# Patient Record
Sex: Female | Born: 1990 | Hispanic: No | Marital: Married | Smoking: Never smoker
Health system: Southern US, Community
[De-identification: ages and names within clinical notes are randomized; demographics above are authoritative.]

## PROBLEM LIST (undated history)

## (undated) ENCOUNTER — Inpatient Hospital Stay (HOSPITAL_COMMUNITY): Payer: Self-pay

## (undated) DIAGNOSIS — Z789 Other specified health status: Secondary | ICD-10-CM

## (undated) DIAGNOSIS — A0100 Typhoid fever, unspecified: Secondary | ICD-10-CM

---

## 2015-07-02 ENCOUNTER — Ambulatory Visit (HOSPITAL_COMMUNITY): Payer: Self-pay

## 2015-07-02 ENCOUNTER — Ambulatory Visit (HOSPITAL_COMMUNITY)
Admission: RE | Admit: 2015-07-02 | Discharge: 2015-07-02 | Disposition: A | Discharge status: Home / Self Care | Payer: Self-pay | Source: Ambulatory Visit | Attending: Nurse Practitioner | Admitting: Nurse Practitioner

## 2015-07-02 ENCOUNTER — Ambulatory Visit (HOSPITAL_COMMUNITY): Admission: RE | Admit: 2015-07-02 | Payer: Self-pay | Source: Ambulatory Visit

## 2015-07-02 ENCOUNTER — Other Ambulatory Visit: Payer: Self-pay | Admitting: Nurse Practitioner

## 2015-07-02 DIAGNOSIS — N23 Unspecified renal colic: Secondary | ICD-10-CM

## 2015-07-02 DIAGNOSIS — R109 Unspecified abdominal pain: Secondary | ICD-10-CM

## 2015-07-02 DIAGNOSIS — R8290 Unspecified abnormal findings in urine: Secondary | ICD-10-CM | POA: Insufficient documentation

## 2016-03-21 NOTE — L&D Delivery Note (Signed)
Delivery Note At 3:14 PM a viable female was delivered via Vaginal, Spontaneous (Presentation: ;  ).  APGAR: 6, 8; weight  .   Placenta status: , .  Cord:  with the following complications: .  Cord pH: NA  Anesthesia:  epidural Episiotomy: None Lacerations: 1st degree Suture Repair: hemostatic, no repair  Est. Blood Loss (mL): 250  Mom to postpartum.  Baby to Couplet care / Skin to Skin.  Thressa ShellerHeather Kassandra Meriweather 01/28/2017, 3:50 PM

## 2016-06-21 ENCOUNTER — Encounter: Payer: Self-pay | Admitting: Emergency Medicine

## 2016-06-21 ENCOUNTER — Emergency Department (HOSPITAL_COMMUNITY)
Admission: EM | Admit: 2016-06-21 | Discharge: 2016-06-22 | Disposition: A | Discharge status: Home / Self Care | Payer: Medicaid Other | Attending: Emergency Medicine | Admitting: Emergency Medicine

## 2016-06-21 DIAGNOSIS — O2310 Infections of bladder in pregnancy, unspecified trimester: Secondary | ICD-10-CM | POA: Diagnosis not present

## 2016-06-21 DIAGNOSIS — Z3A08 8 weeks gestation of pregnancy: Secondary | ICD-10-CM | POA: Diagnosis not present

## 2016-06-21 DIAGNOSIS — O98511 Other viral diseases complicating pregnancy, first trimester: Secondary | ICD-10-CM | POA: Diagnosis not present

## 2016-06-21 DIAGNOSIS — O9989 Other specified diseases and conditions complicating pregnancy, childbirth and the puerperium: Secondary | ICD-10-CM | POA: Diagnosis present

## 2016-06-21 DIAGNOSIS — O0281 Inappropriate change in quantitative human chorionic gonadotropin (hCG) in early pregnancy: Secondary | ICD-10-CM | POA: Diagnosis not present

## 2016-06-21 DIAGNOSIS — R69 Illness, unspecified: Secondary | ICD-10-CM

## 2016-06-21 DIAGNOSIS — J111 Influenza due to unidentified influenza virus with other respiratory manifestations: Secondary | ICD-10-CM | POA: Diagnosis not present

## 2016-06-21 DIAGNOSIS — N3000 Acute cystitis without hematuria: Secondary | ICD-10-CM

## 2016-06-21 LAB — CBC WITH DIFFERENTIAL/PLATELET
BASOS ABS: 0 10*3/uL (ref 0.0–0.1)
BASOS PCT: 0 %
EOS ABS: 0 10*3/uL (ref 0.0–0.7)
EOS PCT: 0 %
HCT: 33.8 % — ABNORMAL LOW (ref 36.0–46.0)
Hemoglobin: 11.5 g/dL — ABNORMAL LOW (ref 12.0–15.0)
Lymphocytes Relative: 12 %
Lymphs Abs: 0.8 10*3/uL (ref 0.7–4.0)
MCH: 29.6 pg (ref 26.0–34.0)
MCHC: 34 g/dL (ref 30.0–36.0)
MCV: 87.1 fL (ref 78.0–100.0)
MONO ABS: 0.6 10*3/uL (ref 0.1–1.0)
Monocytes Relative: 8 %
Neutro Abs: 5.5 10*3/uL (ref 1.7–7.7)
Neutrophils Relative %: 80 %
PLATELETS: 126 10*3/uL — AB (ref 150–400)
RBC: 3.88 MIL/uL (ref 3.87–5.11)
RDW: 13.3 % (ref 11.5–15.5)
WBC: 6.9 10*3/uL (ref 4.0–10.5)

## 2016-06-21 LAB — BASIC METABOLIC PANEL
Anion gap: 9 (ref 5–15)
BUN: 12 mg/dL (ref 6–20)
CALCIUM: 9 mg/dL (ref 8.9–10.3)
CO2: 23 mmol/L (ref 22–32)
Chloride: 103 mmol/L (ref 101–111)
Creatinine, Ser: 0.52 mg/dL (ref 0.44–1.00)
GFR calc Af Amer: >60 mL/min (ref >60)
GLUCOSE: 130 mg/dL — AB (ref 65–99)
POTASSIUM: 3.2 mmol/L — AB (ref 3.5–5.1)
Sodium: 135 mmol/L (ref 135–145)

## 2016-06-21 LAB — RAPID STREP SCREEN (MED CTR MEBANE ONLY): STREPTOCOCCUS, GROUP A SCREEN (DIRECT): NEGATIVE

## 2016-06-21 LAB — HCG, QUANTITATIVE, PREGNANCY: HCG, BETA CHAIN, QUANT, S: 132578 m[IU]/mL — AB (ref <5)

## 2016-06-21 NOTE — ED Triage Notes (Addendum)
Patient here with fever and sore throat.  Patient is also 2 months pregnant, has many questions on what to take for meds for fever and why she has a fever.  Patient is very nauseous especially with prenatal vitamins.  She would like a prescription for nausea medicine.  She has her first prenatal appointment with OBGYN on April 19th.  Patient speaks Urdu.

## 2016-06-22 ENCOUNTER — Emergency Department (HOSPITAL_COMMUNITY): Payer: Medicaid Other

## 2016-06-22 LAB — URINALYSIS, ROUTINE W REFLEX MICROSCOPIC
Bilirubin Urine: NEGATIVE
Glucose, UA: NEGATIVE mg/dL
Hgb urine dipstick: NEGATIVE
Ketones, ur: 5 mg/dL — AB
Nitrite: NEGATIVE
Protein, ur: NEGATIVE mg/dL
Specific Gravity, Urine: 1.029 (ref 1.005–1.030)
pH: 5 (ref 5.0–8.0)

## 2016-06-22 LAB — INFLUENZA PANEL BY PCR (TYPE A & B)
Influenza A By PCR: NEGATIVE
Influenza B By PCR: NEGATIVE

## 2016-06-22 MED ORDER — CEPHALEXIN 500 MG PO CAPS: 500.0000 mg | ORAL_CAPSULE | Freq: Two times a day (BID) | ORAL | 0 refills | Status: DC

## 2016-06-22 MED ORDER — OSELTAMIVIR PHOSPHATE 75 MG PO CAPS: 75.0000 mg | ORAL_CAPSULE | Freq: Two times a day (BID) | ORAL | 0 refills | Status: DC

## 2016-06-22 NOTE — Discharge Instructions (Signed)
Please read attached information. If you experience any new or worsening signs or symptoms please return to the emergency room for evaluation. Please follow-up with your primary care provider or specialist as discussed. Please use medication prescribed only as directed and discontinue taking if you have any concerning signs or symptoms.   °

## 2016-06-22 NOTE — ED Provider Notes (Signed)
[facility]-EMERGENCY DEPT Provider Note   CSN: [id number] Arrival date & time: 06/21/16  1933   History   Chief Complaint Chief Complaint  Patient presents with  . Fever  . Sore Throat    HPI Amy Young is a 26 y.o. female.  HPI   26 year old female presents today with complaints of upper respiratory infection.  Patient notes over the last 2 days she has had low-grade fever at 100, rhinorrhea, sore throat and dry nonproductive cough.  Patient notes diffuse body aches.  Patient denies any abdominal pain, chest pain, shortness of breath.  She denies any urinary complaints.  She denies any chronic health conditions or daily medications, notes taking Tylenol at 6 PM this evening.  Patient reports that she is 2 months pregnant.  No recent travel.  She notes that her daughter is experiencing the same symptoms.   History reviewed. No pertinent past medical history.  There are no active problems to display for this patient.   History reviewed. No pertinent surgical history.  OB History    Gravida Para Term Preterm AB Living   1             SAB TAB Ectopic Multiple Live Births                  Home Medications    Prior to Admission medications   Medication Sig Start Date End Date Taking? Authorizing Provider  cephALEXin (KEFLEX) 500 MG capsule Take 1 capsule (500 mg total) by mouth 2 (two) times daily. 06/22/16   Eyvonne Mechanic, PA-C  oseltamivir (TAMIFLU) 75 MG capsule Take 1 capsule (75 mg total) by mouth every 12 (twelve) hours. 06/22/16   Eyvonne Mechanic, PA-C    Family History History reviewed. No pertinent family history.  Social History Social History  Substance Use Topics  . Smoking status: Never Smoker  . Smokeless tobacco: Never Used  . Alcohol use Not on file     Allergies   Patient has no known allergies.   Review of Systems Review of Systems  All other systems reviewed and are negative.    Physical Exam Updated Vital Signs BP 113/74   Pulse 92   Temp  100 F (37.8 C) (Oral)   Resp 16   Wt 53.9 kg   LMP 04/28/2016   SpO2 99%   Physical Exam  Constitutional: She is oriented to person, place, and time. She appears well-developed and well-nourished.  HENT:  Head: Normocephalic and atraumatic.  Mouth/Throat: Uvula is midline, oropharynx is clear and moist and mucous membranes are normal. No oropharyngeal exudate, posterior oropharyngeal edema, posterior oropharyngeal erythema or tonsillar abscesses.  Rhinorrhea   Eyes: Conjunctivae are normal. Pupils are equal, round, and reactive to light. Right eye exhibits no discharge. Left eye exhibits no discharge. No scleral icterus.  Neck: Normal range of motion. No JVD present. No tracheal deviation present.  Pulmonary/Chest: Effort normal. No stridor.  Neurological: She is alert and oriented to person, place, and time. Coordination normal.  Psychiatric: She has a normal mood and affect. Her behavior is normal. Judgment and thought content normal.  Nursing note and vitals reviewed.    ED Treatments / Results  Labs (all labs ordered are listed, but only abnormal results are displayed) Labs Reviewed  HCG, QUANTITATIVE, PREGNANCY - Abnormal; Notable for the following:       Result Value   hCG, Beta Chain, Quant, S 132,578 (*)    All other components within normal limits  CBC WITH  DIFFERENTIAL/PLATELET - Abnormal; Notable for the following:    Hemoglobin 11.5 (*)    HCT 33.8 (*)    Platelets 126 (*)    All other components within normal limits  BASIC METABOLIC PANEL - Abnormal; Notable for the following:    Potassium 3.2 (*)    Glucose, Bld 130 (*)    All other components within normal limits  URINALYSIS, ROUTINE W REFLEX MICROSCOPIC - Abnormal; Notable for the following:    APPearance CLOUDY (*)    Ketones, ur 5 (*)    Leukocytes, UA MODERATE (*)    Bacteria, UA RARE (*)    Squamous Epithelial / LPF 6-30 (*)    All other components within normal limits  RAPID STREP SCREEN (NOT AT  [facility])  CULTURE, GROUP A STREP [facility])  URINE CULTURE  INFLUENZA PANEL BY PCR (TYPE A & B)    EKG  EKG Interpretation None       Radiology Dg Chest 2 View  Result Date: 06/22/2016 CLINICAL DATA:  Fever and sore throat EXAM: CHEST  2 VIEW COMPARISON:  None. FINDINGS: The heart size and mediastinal contours are within normal limits. Both lungs are clear. Mild S-shaped scoliosis of the thoracolumbar spine. IMPRESSION: No active cardiopulmonary disease. S-shaped thoracolumbar scoliosis. Electronically Signed   By: Tollie Eth M.D.   On: 06/22/2016 01:26    Procedures Procedures (including critical care time)  Medications Ordered in ED Medications - No data to display   Initial Impression / Assessment and Plan / ED Course  I have reviewed the triage vital signs and the nursing notes.  Pertinent labs & imaging results that were available during my care of the patient were reviewed by me and considered in my medical decision making (see chart for details).     Final Clinical Impressions(s) / ED Diagnoses   Final diagnoses:  Acute cystitis without hematuria  Influenza-like illness    Labs: Influenza, urinalysis, rapid strep, hCG, CBC, BMP  Imaging: DG Chest 2 View (shielded)   Consults:  Therapeutics:  Discharge Meds:   Assessment/Plan: 26 year old female presents today with viral type illness.  She reports initial onset of sore throat followed by body aches, dry nonproductive cough, fever fatigue.  Patient also notes lower back pain and body aches.  Patient's presentation is most consistent with influenza type illness although her influenza panel here is negative.  Although it is negative there is still a probability that this could be influenza based on my clinical suspicion.  Treatment versus nontreatment with the patient was discussed with my recommendation of patient receiving Tamiflu.  Patient also noted to have pyuria, she will be treated with Keflex and a urine culture  will be sent.  Patient is instructed to follow-up with primary care and OB/GYN as previously scheduled, return to emergency room immediately if any new or worsening signs or symptoms present.  She verbalized understanding and agreement to today's plan had no further questions or concerns at the time discharge    New Prescriptions New Prescriptions   CEPHALEXIN (KEFLEX) 500 MG CAPSULE    Take 1 capsule (500 mg total) by mouth 2 (two) times daily.   OSELTAMIVIR (TAMIFLU) 75 MG CAPSULE    Take 1 capsule (75 mg total) by mouth every 12 (twelve) hours.     Eyvonne Mechanic, PA-C 06/22/16 4098    Shon Baton, MD 06/22/16 [phone]

## 2016-06-23 LAB — CULTURE, GROUP A STREP (THRC)

## 2016-06-23 LAB — URINE CULTURE: Culture: 70000 — AB

## 2016-06-24 ENCOUNTER — Telehealth: Payer: Self-pay

## 2016-06-24 NOTE — Telephone Encounter (Deleted)
Post ED Visit - Positive Culture Follow-up: Unsuccessful Patient Follow-up  Culture assessed and recommendations reviewed by:   Enzo Bi, Pharm.D.  Celedonio Miyamoto, Pharm.D., BCPS AQ-ID  Garvin Fila, Pharm.D., BCPS  Georgina Pillion, Pharm.D., BCPS  [place], [address].D., BCPS, AAHIVP  Estella Husk, Pharm.D., BCPS, AAHIVP  Lysle Pearl, PharmD, BCPS  Casilda Carls, PharmD, BCPS  Pollyann Samples, PharmD, BCPS Katie Adriana Simas Pharm D Positive urine culture   Patient discharged without antimicrobial prescription and treatment is now indicated  Organism is resistant to prescribed ED discharge antimicrobial  Patient with positive blood cultures   Unable to contact patient after 3 attempts, letter will be sent to address on file  Jerry Caras 06/24/2016, 9:38 AM

## 2016-06-24 NOTE — Progress Notes (Signed)
ED Antimicrobial Stewardship Positive Culture Follow Up   Amy Young is an 26 y.o. female who presented to [facility] on 06/21/2016 with a chief complaint of  Chief Complaint  Patient presents with  . Fever  . Sore Throat    Recent Results (from the past 720 hour(s))  Rapid strep screen     Status: None   Collection Time: 06/21/16  8:41 PM  Result Value Ref Range Status   Streptococcus, Group A Screen (Direct) NEGATIVE NEGATIVE Final    Comment: (NOTE) A Rapid Antigen test may result negative if the antigen level in the sample is below the detection level of this test. The FDA has not cleared this test as a stand-alone test therefore the rapid antigen negative result has reflexed to a Group A Strep culture.   Culture, group A strep     Status: None   Collection Time: 06/21/16  8:41 PM  Result Value Ref Range Status   Specimen Description THROAT  Final   Special Requests NONE  Final   Culture ABUNDANT GROUP A STREP (S.PYOGENES) ISOLATED  Final   Report Status 06/23/2016 FINAL  Final  Urine culture     Status: Abnormal   Collection Time: 06/22/16  1:39 AM  Result Value Ref Range Status   Specimen Description URINE, CLEAN CATCH  Final   Special Requests NONE  Final   Culture 70,000 COLONIES/mL LACTOBACILLUS SPECIES (A)  Final   Report Status 06/23/2016 FINAL  Final      Patient discharged originally without antimicrobial agent and treatment is now indicated  New antibiotic prescription: Start Amoxicillin 500 mg BID x 10 days.  Stop taking cephalexin.   ED Provider: Marlon Pel, PA-C   Oda Cogan, PharmD Candidate 06/24/2016, 8:47 AM

## 2016-06-24 NOTE — Telephone Encounter (Signed)
Post ED Visit - Positive Culture Follow-up: Unsuccessful Patient Follow-up  Culture assessed and recommendations reviewed by:   Enzo Bi, Pharm.D.  Celedonio Miyamoto, Pharm.D., BCPS AQ-ID  Garvin Fila, Pharm.D., BCPS  Georgina Pillion, [address].D., BCPS  [employer], [address].D., BCPS, AAHIVP  Estella Husk, Pharm.D., BCPS, AAHIVP  Lysle Pearl, PharmD, BCPS  Casilda Carls, PharmD, BCPS  Pollyann Samples, PharmD, BCPS Katie Adriana Simas Pharm D Positive Strep culture   Patient discharged without antimicrobial prescription and treatment is now indicated  Organism is resistant to prescribed ED discharge antimicrobial  Patient with positive blood cultures   Unable to contact patient after 3 attempts, letter will be sent to address on file  Jerry Caras 06/24/2016, 10:17 AM

## 2016-07-11 LAB — OB RESULTS CONSOLE VARICELLA ZOSTER ANTIBODY, IGG: Varicella: IMMUNE

## 2016-07-11 LAB — OB RESULTS CONSOLE RPR: RPR: NONREACTIVE

## 2016-07-11 LAB — OB RESULTS CONSOLE HIV ANTIBODY (ROUTINE TESTING): HIV: NONREACTIVE

## 2016-07-11 LAB — OB RESULTS CONSOLE HEPATITIS B SURFACE ANTIGEN: Hepatitis B Surface Ag: NEGATIVE

## 2016-07-11 LAB — OB RESULTS CONSOLE RUBELLA ANTIBODY, IGM: RUBELLA: IMMUNE

## 2016-07-25 ENCOUNTER — Encounter (HOSPITAL_COMMUNITY): Payer: Self-pay | Admitting: *Deleted

## 2016-07-26 ENCOUNTER — Other Ambulatory Visit (HOSPITAL_COMMUNITY): Payer: Self-pay | Admitting: Nurse Practitioner

## 2016-07-26 DIAGNOSIS — Z3682 Encounter for antenatal screening for nuchal translucency: Secondary | ICD-10-CM

## 2016-07-26 DIAGNOSIS — Z3A13 13 weeks gestation of pregnancy: Secondary | ICD-10-CM

## 2016-07-27 ENCOUNTER — Ambulatory Visit (HOSPITAL_COMMUNITY)
Admission: RE | Admit: 2016-07-27 | Discharge: 2016-07-27 | Disposition: A | Discharge status: Home / Self Care | Payer: Medicaid Other | Source: Ambulatory Visit | Attending: Nurse Practitioner | Admitting: Nurse Practitioner

## 2016-07-27 ENCOUNTER — Other Ambulatory Visit (HOSPITAL_COMMUNITY): Payer: Self-pay | Admitting: Nurse Practitioner

## 2016-07-27 ENCOUNTER — Encounter (HOSPITAL_COMMUNITY): Payer: Self-pay

## 2016-07-27 DIAGNOSIS — Z3682 Encounter for antenatal screening for nuchal translucency: Secondary | ICD-10-CM | POA: Insufficient documentation

## 2016-07-27 DIAGNOSIS — Z3A13 13 weeks gestation of pregnancy: Secondary | ICD-10-CM | POA: Diagnosis not present

## 2016-07-27 DIAGNOSIS — Z843 Family history of consanguinity: Secondary | ICD-10-CM

## 2016-07-27 HISTORY — DX: Other specified health status: Z78.9

## 2016-08-01 ENCOUNTER — Other Ambulatory Visit: Payer: Self-pay

## 2016-09-20 ENCOUNTER — Telehealth: Payer: Self-pay | Admitting: Emergency Medicine

## 2016-09-20 NOTE — Telephone Encounter (Signed)
LOST TO FOLLOWUP 

## 2017-01-02 ENCOUNTER — Encounter (HOSPITAL_COMMUNITY): Payer: Self-pay | Admitting: *Deleted

## 2017-01-02 ENCOUNTER — Inpatient Hospital Stay (HOSPITAL_COMMUNITY)
Admission: AD | Admit: 2017-01-02 | Discharge: 2017-01-02 | Disposition: A | Discharge status: Home / Self Care | Payer: Medicaid Other | Source: Ambulatory Visit | Attending: Family Medicine | Admitting: Family Medicine

## 2017-01-02 DIAGNOSIS — O368130 Decreased fetal movements, third trimester, not applicable or unspecified: Secondary | ICD-10-CM | POA: Diagnosis not present

## 2017-01-02 DIAGNOSIS — Z3A36 36 weeks gestation of pregnancy: Secondary | ICD-10-CM | POA: Insufficient documentation

## 2017-01-02 DIAGNOSIS — O34211 Maternal care for low transverse scar from previous cesarean delivery: Secondary | ICD-10-CM | POA: Diagnosis not present

## 2017-01-02 LAB — OB RESULTS CONSOLE GBS: STREP GROUP B AG: NEGATIVE

## 2017-01-02 LAB — URINALYSIS, ROUTINE W REFLEX MICROSCOPIC
Bilirubin Urine: NEGATIVE
Glucose, UA: NEGATIVE mg/dL
Hgb urine dipstick: NEGATIVE
Ketones, ur: NEGATIVE mg/dL
Nitrite: NEGATIVE
Protein, ur: NEGATIVE mg/dL
SPECIFIC GRAVITY, URINE: 1.008 (ref 1.005–1.030)
pH: 7 (ref 5.0–8.0)

## 2017-01-02 NOTE — MAU Note (Signed)
Decreased fetal movement States feels some movement but not as much as usual Ongoing for the past 4-5 days  Had a non-reactive NST at the [facility] department.  Denies LOF or VB.

## 2017-01-02 NOTE — Discharge Instructions (Signed)

## 2017-01-02 NOTE — MAU Provider Note (Signed)
History     CSN: [id number]  Arrival date and time: 01/02/17 7829   First Provider Initiated Contact with Patient 01/02/17 1723    Pashto interpreter via [employer] Complaint  Patient presents with  . Decreased Fetal Movement  . non reactive nst   Amy Young is a 26 y.o. G2P1001 at [redacted]w[redacted]d sent from [facility], [place] due to report of decreased FM and NR-NST. She states the baby only moved once this morning before her PN visit, but she is perceiving good FM at present. FM has been less than usual for the last 6 days.  No contractions, abdominal pain, LOF or vaginal bleeding.  Pregnancy significant for previous C/S, consanquinity (FOB 1st cousing), borderline stable thrombocytopenia.     OB History  Gravida Para Term Preterm AB Living  SAB TAB Ectopic Multiple Live Births               # Outcome Date GA Lbr Len/2nd Weight Sex Delivery Anes PTL Lv  2 Current           1 Term      CS-LTranv          Past Medical History:  Diagnosis Date  . Medical history non-contributory     Past Surgical History:  Procedure Laterality Date  . CESAREAN SECTION      History reviewed. No pertinent family history.  Social History  Substance Use Topics  . Smoking status: Never Smoker  . Smokeless tobacco: Never Used  . Alcohol use No    Allergies: No Known Allergies  Prescriptions Prior to Admission  Medication Sig Dispense Refill Last Dose  . cephALEXin (KEFLEX) 500 MG capsule Take 1 capsule (500 mg total) by mouth 2 (two) times daily. (Patient not taking: Reported on 07/27/2016) 10 capsule 0 Not Taking  . oseltamivir (TAMIFLU) 75 MG capsule Take 1 capsule (75 mg total) by mouth every 12 (twelve) hours. (Patient not taking: Reported on 07/27/2016) 10 capsule 0 Not Taking  . Prenatal Vit-Fe Fumarate-FA (PRENATAL VITAMIN PO) Take by mouth.   Taking    Review of Systems  Constitutional: Negative for fever.  Gastrointestinal: Negative for abdominal pain, nausea and  vomiting.  Genitourinary: Negative for dysuria, frequency, vaginal bleeding and vaginal discharge.  Hematological: Does not bruise/bleed easily.   Physical Exam   Blood pressure 117/67, pulse 87, temperature 97.8 F (36.6 C), temperature source Oral, resp. rate 16, weight 146 lb 1.9 oz (66.3 kg), last menstrual period 04/21/2016, SpO2 100 %.  Physical Exam  Constitutional: She is oriented to person, place, and time. She appears well-developed and well-nourished. No distress.  HENT:  Head: Normocephalic.  Eyes: Pupils are equal, round, and reactive to light.  Neck: Neck supple.  Cardiovascular: Normal rate.   Respiratory: Effort normal.  GI: Soft. There is no tenderness. There is no guarding.  FH 35cm cephalic  Musculoskeletal: She exhibits no edema.  Neurological: She is alert and oriented to person, place, and time.  Skin: Skin is warm and dry.  Psychiatric: She has a normal mood and affect. Her behavior is normal.    MAU Course  Procedures  Fetal monitoring Baseline 120-125, moderate variability, accelerations present, no decelerations Toco: occasional mild UC  Brief [place] by me: cephalic, normal AFV   Assessment and Plan  G2P1001 at [redacted]w[redacted]d 1. Decreased fetal movement affecting management of pregnancy in third trimester, single or unspecified fetus    Fetal well-being by reactive  NST and subjectively normal AFV  Allergies as of 01/02/2017   No Known Allergies     Medication List    STOP taking these medications   cephALEXin 500 MG capsule Commonly known as:  KEFLEX   oseltamivir 75 MG capsule Commonly known as:  TAMIFLU     TAKE these medications   PRENATAL VITAMIN PO Take by mouth.      Follow-up Information    Department, [facility]. Schedule an appointment as soon as possible for a visit in 1 week(s).   Contact information: [address] [place] [address] [phone]          Merle Cirelli CNM 01/02/2017, 5:24 PM

## 2017-01-28 ENCOUNTER — Inpatient Hospital Stay (HOSPITAL_COMMUNITY): Payer: Medicaid Other | Admitting: Anesthesiology

## 2017-01-28 ENCOUNTER — Inpatient Hospital Stay (HOSPITAL_COMMUNITY)
Admission: AD | Admit: 2017-01-28 | Discharge: 2017-01-28 | Disposition: A | Discharge status: Home / Self Care | Payer: Medicaid Other | Source: Ambulatory Visit | Attending: Obstetrics & Gynecology | Admitting: Obstetrics & Gynecology

## 2017-01-28 ENCOUNTER — Encounter (HOSPITAL_COMMUNITY): Payer: Self-pay

## 2017-01-28 ENCOUNTER — Inpatient Hospital Stay (HOSPITAL_COMMUNITY)
Admission: AD | Admit: 2017-01-28 | Discharge: 2017-01-29 | DRG: 806 | Disposition: A | Discharge status: Home / Self Care | Payer: Medicaid Other | Source: Ambulatory Visit | Attending: Obstetrics & Gynecology | Admitting: Obstetrics & Gynecology

## 2017-01-28 ENCOUNTER — Other Ambulatory Visit: Payer: Self-pay

## 2017-01-28 DIAGNOSIS — D696 Thrombocytopenia, unspecified: Secondary | ICD-10-CM | POA: Diagnosis present

## 2017-01-28 DIAGNOSIS — O34211 Maternal care for low transverse scar from previous cesarean delivery: Principal | ICD-10-CM | POA: Diagnosis present

## 2017-01-28 DIAGNOSIS — O9912 Other diseases of the blood and blood-forming organs and certain disorders involving the immune mechanism complicating childbirth: Secondary | ICD-10-CM | POA: Diagnosis present

## 2017-01-28 DIAGNOSIS — O471 False labor at or after 37 completed weeks of gestation: Secondary | ICD-10-CM

## 2017-01-28 DIAGNOSIS — O34219 Maternal care for unspecified type scar from previous cesarean delivery: Secondary | ICD-10-CM

## 2017-01-28 DIAGNOSIS — Z3A40 40 weeks gestation of pregnancy: Secondary | ICD-10-CM

## 2017-01-28 DIAGNOSIS — Z3483 Encounter for supervision of other normal pregnancy, third trimester: Secondary | ICD-10-CM | POA: Diagnosis present

## 2017-01-28 LAB — CBC
HEMATOCRIT: 37.1 % (ref 36.0–46.0)
Hemoglobin: 12.9 g/dL (ref 12.0–15.0)
MCH: 32.6 pg (ref 26.0–34.0)
MCHC: 34.8 g/dL (ref 30.0–36.0)
MCV: 93.7 fL (ref 78.0–100.0)
Platelets: 138 10*3/uL — ABNORMAL LOW (ref 150–400)
RBC: 3.96 MIL/uL (ref 3.87–5.11)
RDW: 12.3 % (ref 11.5–15.5)
WBC: 9.5 10*3/uL (ref 4.0–10.5)

## 2017-01-28 LAB — TYPE AND SCREEN
ABO/RH(D): A POS
ANTIBODY SCREEN: NEGATIVE

## 2017-01-28 LAB — ABO/RH: ABO/RH(D): A POS

## 2017-01-28 MED ORDER — WITCH HAZEL-GLYCERIN EX PADS: 1.0000 "application " | MEDICATED_PAD | CUTANEOUS | Status: DC | PRN

## 2017-01-28 MED ORDER — DIBUCAINE 1 % RE OINT: 1.0000 "application " | TOPICAL_OINTMENT | RECTAL | Status: DC | PRN

## 2017-01-28 MED ORDER — IBUPROFEN 600 MG PO TABS
600.0000 mg | ORAL_TABLET | Freq: Four times a day (QID) | ORAL | Status: DC
  Administered 2017-01-28 – 2017-01-29 (×4): 600 mg via ORAL
  Filled 2017-01-28 (×5): qty 1

## 2017-01-28 MED ORDER — ONDANSETRON HCL 4 MG PO TABS: 4.0000 mg | ORAL_TABLET | ORAL | Status: DC | PRN

## 2017-01-28 MED ORDER — LACTATED RINGERS IV SOLN
INTRAVENOUS | Status: DC
  Administered 2017-01-28 (×2): via INTRAVENOUS

## 2017-01-28 MED ORDER — EPHEDRINE 5 MG/ML INJ
10.0000 mg | INTRAVENOUS | Status: DC | PRN
  Filled 2017-01-28: qty 2

## 2017-01-28 MED ORDER — ONDANSETRON HCL 4 MG/2ML IJ SOLN: 4.0000 mg | INTRAMUSCULAR | Status: DC | PRN

## 2017-01-28 MED ORDER — DIPHENHYDRAMINE HCL 25 MG PO CAPS: 25.0000 mg | ORAL_CAPSULE | Freq: Four times a day (QID) | ORAL | Status: DC | PRN

## 2017-01-28 MED ORDER — OXYTOCIN BOLUS FROM INFUSION
500.0000 mL | Freq: Once | INTRAVENOUS | Status: AC
  Administered 2017-01-28: 500 mL via INTRAVENOUS

## 2017-01-28 MED ORDER — FENTANYL CITRATE (PF) 100 MCG/2ML IJ SOLN: 50.0000 ug | INTRAMUSCULAR | Status: DC | PRN

## 2017-01-28 MED ORDER — LACTATED RINGERS IV SOLN: 500.0000 mL | INTRAVENOUS | Status: DC | PRN

## 2017-01-28 MED ORDER — ACETAMINOPHEN 325 MG PO TABS: 650.0000 mg | ORAL_TABLET | ORAL | Status: DC | PRN

## 2017-01-28 MED ORDER — PHENYLEPHRINE 40 MCG/ML (10ML) SYRINGE FOR IV PUSH (FOR BLOOD PRESSURE SUPPORT)
80.0000 ug | PREFILLED_SYRINGE | INTRAVENOUS | Status: DC | PRN
  Filled 2017-01-28: qty 10
  Filled 2017-01-28: qty 5

## 2017-01-28 MED ORDER — COCONUT OIL OIL: 1.0000 "application " | TOPICAL_OIL | Status: DC | PRN

## 2017-01-28 MED ORDER — LIDOCAINE HCL (PF) 1 % IJ SOLN
INTRAMUSCULAR | Status: DC | PRN
  Administered 2017-01-28 (×2): 4 mL via EPIDURAL

## 2017-01-28 MED ORDER — OXYTOCIN 40 UNITS IN LACTATED RINGERS INFUSION - SIMPLE MED
2.5000 [IU]/h | INTRAVENOUS | Status: DC
  Filled 2017-01-28: qty 1000

## 2017-01-28 MED ORDER — LIDOCAINE HCL (PF) 1 % IJ SOLN
30.0000 mL | INTRAMUSCULAR | Status: DC | PRN
  Filled 2017-01-28: qty 30

## 2017-01-28 MED ORDER — OXYCODONE-ACETAMINOPHEN 5-325 MG PO TABS: 1.0000 | ORAL_TABLET | ORAL | Status: DC | PRN

## 2017-01-28 MED ORDER — LACTATED RINGERS IV SOLN
500.0000 mL | Freq: Once | INTRAVENOUS | Status: AC
  Administered 2017-01-28: 10:00:00 via INTRAVENOUS

## 2017-01-28 MED ORDER — PHENYLEPHRINE 40 MCG/ML (10ML) SYRINGE FOR IV PUSH (FOR BLOOD PRESSURE SUPPORT)
80.0000 ug | PREFILLED_SYRINGE | INTRAVENOUS | Status: DC | PRN
  Administered 2017-01-28 (×2): 80 ug via INTRAVENOUS
  Filled 2017-01-28: qty 5

## 2017-01-28 MED ORDER — ZOLPIDEM TARTRATE 5 MG PO TABS: 5.0000 mg | ORAL_TABLET | Freq: Every evening | ORAL | Status: DC | PRN

## 2017-01-28 MED ORDER — OXYCODONE-ACETAMINOPHEN 5-325 MG PO TABS: 2.0000 | ORAL_TABLET | ORAL | Status: DC | PRN

## 2017-01-28 MED ORDER — LACTATED RINGERS IV SOLN: 500.0000 mL | Freq: Once | INTRAVENOUS | Status: DC

## 2017-01-28 MED ORDER — PRENATAL MULTIVITAMIN CH
1.0000 | ORAL_TABLET | Freq: Every day | ORAL | Status: DC
  Administered 2017-01-29: 1 via ORAL
  Filled 2017-01-28: qty 1

## 2017-01-28 MED ORDER — ONDANSETRON HCL 4 MG/2ML IJ SOLN: 4.0000 mg | Freq: Four times a day (QID) | INTRAMUSCULAR | Status: DC | PRN

## 2017-01-28 MED ORDER — FENTANYL 2.5 MCG/ML BUPIVACAINE 1/10 % EPIDURAL INFUSION (WH - ANES)
14.0000 mL/h | INTRAMUSCULAR | Status: DC | PRN
  Administered 2017-01-28: 14 mL/h via EPIDURAL
  Filled 2017-01-28: qty 100

## 2017-01-28 MED ORDER — SOD CITRATE-CITRIC ACID 500-334 MG/5ML PO SOLN: 30.0000 mL | ORAL | Status: DC | PRN

## 2017-01-28 MED ORDER — TETANUS-DIPHTH-ACELL PERTUSSIS 5-2.5-18.5 LF-MCG/0.5 IM SUSP: 0.5000 mL | Freq: Once | INTRAMUSCULAR | Status: DC

## 2017-01-28 MED ORDER — SENNOSIDES-DOCUSATE SODIUM 8.6-50 MG PO TABS
2.0000 | ORAL_TABLET | ORAL | Status: DC
  Administered 2017-01-29: 2 via ORAL
  Filled 2017-01-28: qty 2

## 2017-01-28 MED ORDER — DIPHENHYDRAMINE HCL 50 MG/ML IJ SOLN: 12.5000 mg | INTRAMUSCULAR | Status: DC | PRN

## 2017-01-28 MED ORDER — SIMETHICONE 80 MG PO CHEW: 80.0000 mg | CHEWABLE_TABLET | ORAL | Status: DC | PRN

## 2017-01-28 MED ORDER — BENZOCAINE-MENTHOL 20-0.5 % EX AERO: 1.0000 "application " | INHALATION_SPRAY | CUTANEOUS | Status: DC | PRN

## 2017-01-28 NOTE — Anesthesia Preprocedure Evaluation (Signed)
Anesthesia Evaluation  Patient identified by MRN, date of birth, ID band Patient awake    Reviewed: Allergy & Precautions, Patient's Chart, lab work & pertinent test results  Airway Mallampati: III  TM Distance: >3 FB Neck ROM: Full    Dental no notable dental hx. (+) Teeth Intact   Pulmonary neg pulmonary ROS,    Pulmonary exam normal breath sounds clear to auscultation       Cardiovascular negative cardio ROS Normal cardiovascular exam Rhythm:Regular Rate:Normal     Neuro/Psych negative neurological ROS  negative psych ROS   GI/Hepatic Neg liver ROS, GERD  ,  Endo/Other  negative endocrine ROS  Renal/GU negative Renal ROS  negative genitourinary   Musculoskeletal negative musculoskeletal ROS (+)   Abdominal   Peds  Hematology Thrombocytopenia-mild   Anesthesia Other Findings   Reproductive/Obstetrics (+) Pregnancy Previous C/section                             Anesthesia Physical Anesthesia Plan  ASA: II  Anesthesia Plan: Epidural   Post-op Pain Management:    Induction:   PONV Risk Score and Plan:   Airway Management Planned: Natural Airway  Additional Equipment:   Intra-op Plan:   Post-operative Plan:   Informed Consent: I have reviewed the patients History and Physical, chart, labs and discussed the procedure including the risks, benefits and alternatives for the proposed anesthesia with the patient or authorized representative who has indicated his/her understanding and acceptance.     Plan Discussed with: Anesthesiologist  Anesthesia Plan Comments:         Anesthesia Quick Evaluation

## 2017-01-28 NOTE — Progress Notes (Signed)
Significant other at bedside.  Translator paper signed and placed in chart.  Nurse introduced self to sig other.  Plan of care discussed with sig other and he translated to pt.

## 2017-01-28 NOTE — Discharge Instructions (Signed)

## 2017-01-28 NOTE — Progress Notes (Signed)
80 mcg of phenylephrine administered, pt asymptomatic  

## 2017-01-28 NOTE — MAU Note (Signed)
Pt reports pain started at 2100. No leaking of fluid or bleeding.  + fetal movement.

## 2017-01-28 NOTE — Anesthesia Pain Management Evaluation Note (Signed)
  CRNA Pain Management Visit Note  Patient: Amy Young, 26 y.o., female  "Hello I am a member of the anesthesia team at [facility]. We have an anesthesia team available at all times to provide care throughout the hospital, including epidural management and anesthesia for C-section. I don't know your plan for the delivery whether it a natural birth, water birth, IV sedation, nitrous supplementation, doula or epidural, but we want to meet your pain goals."   1.Was your pain managed to your expectations on prior hospitalizations?   Yes   2.What is your expectation for pain management during this hospitalization?     Epidural  3.How can we help you reach that goal?   Record the patient's initial score and the patient's pain goal.   Pain: 0  Pain Goal: 3 [facility] wants you to be able to say your pain was always managed very well.  Amy Young,Amy Young 01/28/2017

## 2017-01-28 NOTE — H&P (Signed)
Amy Young is a 26 y.o. female presenting for SROM and onset of labor.  Language line used, Pashto interpretor.  OB History    Gravida Para Term Preterm AB Living   2 1 1     1    SAB TAB Ectopic Multiple Live Births                 Past Medical History:  Diagnosis Date  . Medical history non-contributory    Past Surgical History:  Procedure Laterality Date  . CESAREAN SECTION     Family History: family history is not on file. Social History:  reports that  has never smoked. she has never used smokeless tobacco. She reports that she does not drink alcohol or use drugs.     Maternal Diabetes: No Genetic Screening: Normal Maternal Ultrasounds/Referrals: Normal Fetal Ultrasounds or other Referrals:  None Maternal Substance Abuse:  No Significant Maternal Medications:  None Significant Maternal Lab Results:  None Other Comments:  previous c-section x 1 for ?FTP, desires TOLAC  ROS Maternal Medical History:  Reason for admission: Rupture of membranes.   Contractions: Onset was 1-2 hours ago.   Frequency: irregular.   Perceived severity is strong.    Fetal activity: Perceived fetal activity is normal.   Last perceived fetal movement was within the past hour.    Prenatal complications: Thrombocytopenia.   Prenatal Complications - Diabetes: none.    Dilation: 2.5 Effacement (%): 80 Station: -2 Exam by:: g morris rn Blood pressure 130/75, pulse 70, temperature 98.1 F (36.7 C), temperature source Oral, resp. rate 18, last menstrual period 04/21/2016. Maternal Exam:  Uterine Assessment: Contraction strength is firm.  Contraction frequency is regular.   Abdomen: Patient reports no abdominal tenderness. Fetal presentation: vertex  Introitus: Normal vulva. Normal vagina.  Ferning test: positive.  Amniotic fluid character: clear.  Pelvis: adequate for delivery.   Cervix: Cervix evaluated by digital exam.     Fetal Exam Fetal Monitor Review: Mode: ultrasound.    Baseline rate: 120.  Variability: moderate (6-25 bpm).   Pattern: accelerations present and no decelerations.    Fetal State Assessment: Category I - tracings are normal.     Physical Exam  Nursing note and vitals reviewed. Constitutional: She is oriented to person, place, and time. She appears well-developed and well-nourished. No distress.  HENT:  Head: Normocephalic.  Cardiovascular: Normal rate.  Respiratory: Effort normal.  GI: Soft. There is no tenderness. There is no rebound.  Neurological: She is alert and oriented to person, place, and time.  Skin: Skin is warm and dry.  Psychiatric: She has a normal mood and affect.    Prenatal labs: ABO, Rh:   A pos. Antibody:  Negative Rubella:  Immune RPR:   NR HBsAg:   NR HIV:   NR GBS:   Negative  Assessment/Plan: G2P1001 at 1552w2d  Active labor, SROM clear fluid Previous c-section x 1, desires TOLAC, consent signed Epidural for pain control Reassuring M-F status Admit to labor and delivery Expectant management Anticipate NSVD   Thressa ShellerHeather Murel Wigle 01/28/2017, 9:35 AM

## 2017-01-28 NOTE — MAU Note (Signed)
I have communicated with Dr, Verta EllenMinott and reviewed vital signs:  Vitals:   01/28/17 0124 01/28/17 0310  BP: 124/73 105/74  Pulse: 71 79  Resp: 18 18  Temp: 97.8 F (36.6 C) (!) 97.3 F (36.3 C)  SpO2: 100%     Vaginal exam:  Dilation: 1.5 Effacement (%): 60, 70 Cervical Position: Posterior Station: -1 Presentation: Vertex Exam by:: Roxan Hockeyraci Milbert Bixler, RNC,   Also reviewed contraction pattern and that non-stress test is reactive.  It has been documented that patient is contracting every 1-7  minutes with no  cervical change over 2 hours not indicating active labor.  Patient denies any other complaints.  Based on this report provider has given order for discharge.  A discharge order and diagnosis entered by a provider.   Labor discharge instructions reviewed with patient.

## 2017-01-28 NOTE — Anesthesia Procedure Notes (Signed)
Epidural Patient location during procedure: OB Start time: 01/28/2017 9:52 AM  Staffing Anesthesiologist: Mal AmabileFoster, Jayesh Marbach, MD Performed: anesthesiologist   Preanesthetic Checklist Completed: patient identified, site marked, surgical consent, pre-op evaluation, timeout performed, IV checked, risks and benefits discussed and monitors and equipment checked  Epidural Patient position: sitting Prep: site prepped and draped and DuraPrep Patient monitoring: continuous pulse ox and blood pressure Approach: midline Location: L3-L4 Injection technique: LOR air  Needle:  Needle type: Tuohy  Needle gauge: 17 G Needle length: 9 cm and 9 Needle insertion depth: 4 cm Catheter type: closed end flexible Catheter size: 19 Gauge Catheter at skin depth: 9 cm Test dose: negative and Other  Assessment Events: blood not aspirated, injection not painful, no injection resistance, negative IV test and no paresthesia  Additional Notes Patient identified. Risks and benefits discussed including failed block, incomplete  Pain control, post dural puncture headache, nerve damage, paralysis, blood pressure Changes, nausea, vomiting, reactions to medications-both toxic and allergic and post Partum back pain. All questions were answered. Patient expressed understanding and wished to proceed. Sterile technique was used throughout procedure. Epidural site was Dressed with sterile barrier dressing. No paresthesias, signs of intravascular injection Or signs of intrathecal spread were encountered. Interpreter used throughout procedure. Patient was more comfortable after the epidural was dosed. Please see RN's note for documentation of vital signs and FHR which are stable.

## 2017-01-28 NOTE — MAU Note (Signed)
Possible ROM 20-30 min ago. Clear fluid, no vag bleeding. Painful ctx

## 2017-01-29 DIAGNOSIS — O34219 Maternal care for unspecified type scar from previous cesarean delivery: Secondary | ICD-10-CM

## 2017-01-29 LAB — CBC
HEMATOCRIT: 35.4 % — AB (ref 36.0–46.0)
HEMOGLOBIN: 12.2 g/dL (ref 12.0–15.0)
MCH: 33.2 pg (ref 26.0–34.0)
MCHC: 34.5 g/dL (ref 30.0–36.0)
MCV: 96.2 fL (ref 78.0–100.0)
Platelets: 120 10*3/uL — ABNORMAL LOW (ref 150–400)
RBC: 3.68 MIL/uL — AB (ref 3.87–5.11)
RDW: 12.4 % (ref 11.5–15.5)
WBC: 13.6 10*3/uL — AB (ref 4.0–10.5)

## 2017-01-29 LAB — RPR: RPR Ser Ql: NONREACTIVE

## 2017-01-29 MED ORDER — SENNOSIDES-DOCUSATE SODIUM 8.6-50 MG PO TABS: 2.0000 | ORAL_TABLET | ORAL | 0 refills | Status: DC

## 2017-01-29 MED ORDER — IBUPROFEN 600 MG PO TABS: 600.0000 mg | ORAL_TABLET | Freq: Four times a day (QID) | ORAL | 0 refills | Status: DC

## 2017-01-29 MED ORDER — SODIUM CHLORIDE 0.9 % IV BOLUS (SEPSIS)
1000.0000 mL | Freq: Once | INTRAVENOUS | Status: AC
  Administered 2017-01-29: 1000 mL via INTRAVENOUS

## 2017-01-29 NOTE — Anesthesia Postprocedure Evaluation (Signed)
Anesthesia Post Note  Patient: Amy Young  Procedure(s) Performed: AN AD HOC LABOR EPIDURAL     Patient location during evaluation: Mother Baby Anesthesia Type: Epidural Level of consciousness: awake and alert Pain management: pain level controlled Vital Signs Assessment: post-procedure vital signs reviewed and stable Respiratory status: spontaneous breathing, nonlabored ventilation and respiratory function stable Cardiovascular status: stable Postop Assessment: no headache, no backache, epidural receding and patient able to bend at knees Anesthetic complications: no    Last Vitals:  Vitals:   01/28/17 2200 01/29/17 0556  BP: (!) 107/56 (!) 100/54  Pulse: 78 66  Resp: 18 18  Temp:  36.9 C  SpO2:  100%    Last Pain:  Vitals:   01/29/17 0600  TempSrc:   PainSc: 0-No pain   Pain Goal:                 [name],[name]

## 2017-01-29 NOTE — Progress Notes (Signed)
Late entry for 01/29/17 at 0100  Patient and family member at the bedside are both non [place] speaking. Family member called the desk asking for "milk." Went to patient's room to discuss and was unable to communicate effectively. Ipad did not have pt's language. Used the interpreter phone to have an interpreter who speaks Urdu. Used interpreter Tanad ID: Yvonne KendallUrta (just 4 letters, no numbers per interpreter). Pt confirmed with interpreter that she does want to give the baby milk. Educated on the risk and she stated she still wants the milk. She feels like the baby isn't getting enough milk and is hungry every 5 minutes. Educated her to pump and feed baby with bottle of breast milk to accurately measure intake. Pt stated even if she pumps it is not enough. Baby was sleeping at this time. Obtained milk from nursery for the baby. Placed milk on bedside table.   0600- Pt's family member asked for milk for baby. Opened the unopened bottle of milk and place the nipple on it. Baby is in the bed with the mother crying. Nurse picked the baby up to feed the baby the bottle. Family member (assuming she is the pt's mother or grandmother) stopped this nurse and said, "no." Nurse pointed to breast (asking are they going to breast feed and she stated yes). Told the milk will have to be thrown away if not used in 1 hour. 30 minutes later pt called for milk. Went to the room and family member had the patient's husband on facetime and he stated they need fresh warm milk for his baby because he was hungry. Told the husband that the lady just threw the fresh milk away. He stated she want fresh warm milk. Stated the milk is to be given at room temperature. He again stated he wants fresh warm milk for the baby. Nurse tech went to get another bottle of milk and gave it to the family member. At 0700 during bedside report, family member was holding the baby trying to feed the baby the bottle. Report given to oncoming nurse.

## 2017-01-29 NOTE — Discharge Summary (Signed)
OB Discharge Summary     Patient Name: Amy RakersMehwish Young DOB: 1990-08-24 MRN: [id number]  Date of admission: 01/28/2017 Delivering MD: Thressa ShellerHOGAN, HEATHER D   Date of discharge: 01/29/2017  Admitting diagnosis: 36 WKS WATER LEAKING AND CTX Intrauterine pregnancy: 2041w2d     Secondary diagnosis:  Active Problems:   Normal labor   VBAC, delivered  Additional problems:  Patient Active Problem List   Diagnosis Date Noted  . VBAC, delivered 01/29/2017  . Normal labor 01/28/2017       Discharge diagnosis: Term Pregnancy Delivered and VBAC                                                                                                Post partum procedures:none  Augmentation: none  Complications: None  Hospital course:  Onset of Labor With Vaginal Delivery     26 y.o. yo [id number] at 11041w2d was admitted in Latent Labor on 01/28/2017. Patient had an uncomplicated labor course as follows:  Membrane Rupture Time/Date: 8:00 AM ,01/28/2017   Intrapartum Procedures: Episiotomy: None [1]                                         Lacerations:  1st degree [2]  Patient had a delivery of a Viable infant. 01/28/2017  Information for the patient's newborn:  Amy Young, Amy Young [[id number]][[id number]]  Delivery Method: Vag-Spont    Pateint had an uncomplicated postpartum course.  She is ambulating, tolerating a regular diet, passing flatus, and urinating well. Patient is discharged home in stable condition on 01/29/17.   Physical exam  Vitals:   01/28/17 1654 01/28/17 1803 01/28/17 2200 01/29/17 0556  BP: 116/65 (!) 106/59 (!) 107/56 (!) 100/54  Pulse: 76 74 78 66  Resp: 20 18 18 18   Temp: 98.7 F (37.1 C) 99 F (37.2 C)  98.4 F (36.9 C)  TempSrc: Oral Oral  Oral  SpO2:    100%  Height:       General: alert, cooperative and no distress Lochia: appropriate Uterine Fundus: firm Incision: N/A DVT Evaluation: No evidence of DVT seen on physical exam. Labs: Lab Results  Component Value Date   WBC 13.6  (H) 01/29/2017   HGB 12.2 01/29/2017   HCT 35.4 (L) 01/29/2017   MCV 96.2 01/29/2017   PLT 120 (L) 01/29/2017   CMP Latest Ref Rng & Units 06/21/2016  Glucose 65 - 99 mg/dL 952(W130(H)  BUN 6 - 20 mg/dL 12  Creatinine 4.130.44 - 2.441.00 mg/dL 0.100.52  Sodium 272135 - 536145 mmol/L 135  Potassium 3.5 - 5.1 mmol/L 3.2(L)  Chloride 101 - 111 mmol/L 103  CO2 22 - 32 mmol/L 23  Calcium 8.9 - 10.3 mg/dL 9.0    Discharge instruction: per After Visit Summary and "Baby and Me Booklet".  After visit meds:  Allergies as of 01/29/2017   No Known Allergies     Medication List    TAKE these medications   ibuprofen 600 MG tablet Commonly known as:  ADVIL,MOTRIN Take  1 tablet (600 mg total) every 6 (six) hours by mouth.   prenatal multivitamin Tabs tablet Take 1 tablet daily at 12 noon by mouth.   senna-docusate 8.6-50 MG tablet Commonly known as:  Senokot-S Take 2 tablets daily by mouth. Start taking on:  01/30/2017       Diet: routine diet  Activity: Advance as tolerated. Pelvic rest for 6 weeks.   Outpatient follow up:4 weeks Follow up Appt:No future appointments. Follow up Visit:No Follow-up on file.  Postpartum contraception: Undecided  Newborn Data: Live born female  Birth Weight: 7 lb 4.6 oz (3305 g) APGAR: 6, 8  Newborn Delivery   Birth date/time:  01/28/2017 15:14:00 Delivery type:  Vaginal, Spontaneous     Baby Feeding: Bottle and Breast Disposition:home with mother   01/29/2017 Suella BroadKeriann S Minott, MD I assessed this pt and agree with the above assessment

## 2017-01-29 NOTE — Lactation Note (Signed)
This note was copied from a baby's chart. Lactation Consultation Note  Patient Name: [name] [id number]'s Date: 01/29/2017 Reason for consult: Follow-up assessment Family member signed release form to interpreter - Amy OrleansMJADKHAN  Baby is 24 hours old  LC reviewed and updated doc flow sheets per mom  Baby had voided and stools earlier with transitioning stool.  LC showed family.  Mom independent with latch , depth noted, and mom comfortable. Baby still feeding at 15 mins with swallows.  LC reviewed supply and demand. Mom denies soreness, sore nipple and engorgement prevention and tx reviewed.  LC instructed mom on the use of hand pump. And increased flange to #27 if needed when milk comes in.  [name] and [facility].  Mother informed of post-discharge support and given phone number to the lactation department, including services for phone call assistance; out-patient appointments; and breastfeeding support group. List of other breastfeeding resources in the community given in the handout. Encouraged mother to call for problems or concerns related to breastfeeding.    Maternal Data Does the patient have breastfeeding experience prior to this delivery?: Yes  Feeding Feeding Type: Breast Fed Nipple Type: Regular Length of feed: (still feeding at 15 mins , swallows noted )  LATCH Score Latch: Grasps breast easily, tongue down, lips flanged, rhythmical sucking.  Audible Swallowing: Spontaneous and intermittent  Type of Nipple: Everted at rest and after stimulation  Comfort (Breast/Nipple): Soft / non-tender  Hold (Positioning): No assistance needed to correctly position infant at breast.  LATCH Score: 10  Interventions Interventions: Breast feeding basics reviewed;Hand pump  Lactation Tools Discussed/Used Tools: Pump;Flanges Flange Size: 24;27 Breast pump type: Manual WIC Program: Yes([place] ) Pump Review: Setup, frequency, and cleaning;Milk  Storage Initiated by:: MAI  Date initiated:: 01/29/17   Consult Status Consult Status: Complete Date: 01/29/17 Follow-up type: In-patient    [name] 01/29/2017, 3:57 PM

## 2017-01-29 NOTE — Progress Notes (Signed)
POSTPARTUM PROGRESS NOTE  Post Partum Day 1  Subjective:  Amy Young is a 26 y.o. [id number] s/p SVD at 7787w2d.  Patient reports that vaginal bleeding has not decreased. And has changed pad twice with both being saturated. She also reports some generalized weakness and dizziness when she stands up, but is able to ambulate well after a few minutes.  Pt denies problems with voiding or po intake.  She denies nausea or vomiting.  Pain is moderately controlled.  She has had flatus. She has not had bowel movement.  Lochia Moderate.   Objective: Blood pressure (!) 100/54, pulse 66, temperature 98.4 F (36.9 C), temperature source Oral, resp. rate 18, height 5\' 6"  (1.676 m), last menstrual period 04/21/2016, SpO2 100 %, unknown if currently breastfeeding.  Physical Exam:  General: alert, cooperative and no distress Chest: no respiratory distress Heart:regular rate, distal pulses intact Abdomen: soft, nontender,  Uterine Fundus: firm, appropriately tender DVT Evaluation: No calf swelling or tenderness Extremities: no edema Skin: warm, dry  Recent Labs    01/28/17 0849  HGB 12.9  HCT 37.1    Assessment/Plan: Amy Young is a 26 y.o. [id number] s/p SVD at 7087w2d   PPD# 1 - Doing well. Will consider methergine for vaginal bleeding if there is no improvement. Continue to monitor Contraception: undecided Feeding: breast and bottle Dispo: Plan for discharge tomorrow. #Language barrier: husband will be present today at around 9am.   LOS: 1 day   Amy PrudeKeriann S MinottMD 01/29/2017, 9:06 AM

## 2017-01-29 NOTE — Progress Notes (Signed)
Discharge teaching complete via Family member signed release form to interpreter - AMJADKHAN. Pt denies any questions at this time.    

## 2017-01-29 NOTE — Discharge Instructions (Signed)
Vaginal Birth After Cesarean Delivery Vaginal birth after cesarean delivery (VBAC) is giving birth vaginally after previously delivering a baby by a cesarean. In the past, if a woman had a cesarean delivery, all births afterward would be done by cesarean delivery. This is no longer true. It can be safe for the mother to try a vaginal delivery after having a cesarean delivery. It is important to discuss VBAC with your health care provider early in the pregnancy so you can understand the risks, benefits, and options. It will give you time to decide what is best in your particular case. The final decision about whether to have a VBAC or repeat cesarean delivery should be between you and your health care provider. Any changes in your health or your baby's health during your pregnancy may make it necessary to change your initial decision about VBAC. Women who plan to have a VBAC should check with their health care provider to be sure that:  The previous cesarean delivery was done with a low transverse uterine cut (incision) (not a vertical classical incision).  The birth canal is big enough for the baby.  There were no other operations on the uterus.  An electronic fetal monitor (EFM) will be on at all times during labor.  An operating room will be available and ready in case an emergency cesarean delivery is needed.  A health care provider and surgical nursing staff will be available at all times during labor to be ready to do an emergency delivery cesarean if necessary.  An anesthesiologist will be present in case an emergency cesarean delivery is needed.  The nursery is prepared and has adequate personnel and necessary equipment available to care for the baby in case of an emergency cesarean delivery. Benefits of VBAC  Shorter stay in the hospital.  Avoidance of risks associated with cesarean delivery, such as: ? Surgical complications, such as opening of the incision or hernia in the  incision. ? Injury to other organs. ? Fever. This can occur if an infection develops after surgery. It can also occur as a reaction to the medicine given to make you numb during the surgery.  Less blood loss and need for blood transfusions.  Lower risk of blood clots and infection.  Shorter recovery.  Decreased risk for having to remove the uterus (hysterectomy).  Decreased risk for the placenta to completely or partially cover the opening of the uterus (placenta previa) with a future pregnancy.  Decrease risk in future labor and delivery. Risks of a VBAC  Tearing (rupture) of the uterus. This is occurs in less than 1% of VBACs. The risk of this happening is higher if: ? Steps are taken to begin the labor process (induce labor) or stimulate or strengthen contractions (augment labor). ? Medicine is used to soften (ripen) the cervix.  Having to remove the uterus (hysterectomy) if it ruptures. VBAC should not be done if:  The previous cesarean delivery was done with a vertical (classical) or T-shaped incision or you do not know what kind of incision was made.  You had a ruptured uterus.  You have had certain types of surgery on your uterus, such as removal of uterine fibroids. Ask your health care provider about other types of surgeries that prevent you from having a VBAC.  You have certain medical or childbirth (obstetrical) problems.  There are problems with the baby.  You have had two previous cesarean deliveries and no vaginal deliveries. Other facts to know about VBAC:  It   is safe to have an epidural anesthetic with VBAC.  It is safe to turn the baby from a breech position (attempt an external cephalic version).  It is safe to try a VBAC with twins.  VBAC may not be successful if your baby weights 8.8 lb (4 kg) or more. However, weight predictions are not always accurate and should not be used alone to decide if VBAC is right for you.  There is an increased failure rate  if the time between the cesarean delivery and VBAC is less than 19 months.  Your health care provider may advise against a VBAC if you have preeclampsia (high blood pressure, protein in the urine, and swelling of face and extremities).  VBAC is often successful if you previously gave birth vaginally.  VBAC is often successful when the labor starts spontaneously before the due date.  Delivering a baby through a VBAC is similar to having a normal spontaneous vaginal delivery. This information is not intended to replace advice given to you by your health care provider. Make sure you discuss any questions you have with your health care provider. Document Released: 08/28/2006 Document Revised: 08/13/2015 Document Reviewed: 10/04/2012 Elsevier Interactive Patient Education  2018 [employer].  Breastfeeding Challenges and Solutions Even though breastfeeding is natural, it can be challenging, especially in the first few weeks after childbirth. It is normal for problems to arise when starting to breastfeed your new baby, even if you have breastfed before. This document provides some solutions to the most common breastfeeding challenges. Challenges and solutions Challenge--Cracked or Sore Nipples Cracked or sore nipples are commonly experienced by breastfeeding mothers. Cracked or sore nipples often are caused by inadequate latching (when your baby's mouth attaches to your breast to breastfeed). Soreness can also happen if your baby is not positioned properly at your breast. Although nipple cracking and soreness are common during the first week after birth, nipple pain is never normal. If you experience nipple cracking or soreness that lasts longer than 1 week or nipple pain, call your health care provider or lactation consultant. Solution Ensure proper latching and positioning of your baby by following the steps below:  Find a comfortable place to sit or lie down, with your neck and back well  supported.  Place a pillow or rolled up blanket under your baby to bring him or her to the level of your breast (if you are seated).  Make sure that your baby's abdomen is facing your abdomen.  Gently massage your breast. With your fingertips, massage from your chest wall toward your nipple in a circular motion. This encourages milk flow. You may need to continue this action during the feeding if your milk flows slowly.  Support your breast with 4 fingers underneath and your thumb above your nipple. Make sure your fingers are well away from your nipple and your babys mouth.  Stroke your baby's lips gently with your finger or nipple.  When your baby's mouth is open wide enough, quickly bring your baby to your breast, placing your entire nipple and as much of the colored area around your nipple (areola) as possible into your baby's mouth. ? More areola should be visible above your baby's upper lip than below the lower lip. ? Your baby's tongue should be between his or her lower gum and your breast.  Ensure that your baby's mouth is correctly positioned around your nipple (latched). Your baby's lips should create a seal on your breast and be turned out (everted).  It  is common for your baby to suck for about 2-3 minutes in order to start the flow of breast milk.  Signs that your baby has successfully latched on to your nipple include:  Quietly tugging or quietly sucking without causing you pain.  Swallowing heard between every 3-4 sucks.  Muscle movement above and in front of his or her ears with sucking.  Signs that your baby has not successfully latched on to nipple include:  Sucking sounds or smacking sounds from your baby while nursing.  Nipple pain.  Ensure that your breasts stay moisturized and healthy by:  Avoiding the use of soap on your nipples.  Wearing a supportive bra. Avoid wearing underwire-style bras or tight bras.  Air drying your nipples for 3-4 minutes after  each feeding.  Using only cotton bra pads to absorb breast milk leakage. Leaking of breast milk between feedings is normal. Be sure to change the pads if they become soaked with milk.  Using lanolin on your nipples after nursing. Lanolin helps to maintain your skin's normal moisture barrier. If you use pure lanolin you do not need to wash it off before feeding your baby again. Pure lanolin is not toxic to your baby. You may also hand express a few drops of breast milk and gently massage that milk into your nipples, allowing it to air dry.  Challenge--Breast Engorgement Breast engorgement is the overfilling of your breasts with breast milk. In the first few weeks after giving birth, you may experience breast engorgement. Breast engorgement can make your breasts throb and feel hard, tightly stretched, warm, and tender. Engorgement peaks about the fifth day after you give birth. Having breast engorgement does not mean you have to stop breastfeeding your baby. Solution  Breastfeed when you feel the need to reduce the fullness of your breasts or when your baby shows signs of hunger. This is called "breastfeeding on demand."  Newborns (babies younger than 4 weeks) often breastfeed every 1-3 hours during the day. You may need to awaken your baby to feed if he or she is asleep at a feeding time.  Do not allow your baby to sleep longer than 5 hours during the night without a feeding.  Pump or hand express breast milk before breastfeeding to soften your breast, areola, and nipple.  Apply warm, moist heat (in the shower or with warm water-soaked hand towels) just before feeding or pumping, or massage your breast before or during breastfeeding. This increases circulation and helps your milk to flow.  Completely empty your breasts when breastfeeding or pumping. Afterward, wear a snug bra (nursing or regular) or tank top for 1-2 days to signal your body to slightly decrease milk production. Only wear snug bras  or tank tops to treat engorgement. Tight bras typically should be avoided by breastfeeding mothers. Once engorgement is relieved, return to wearing regular, loose-fitting clothes.  Apply ice packs to your breasts to lessen the pain from engorgement and relieve swelling, unless the ice is uncomfortable for you.  Do not delay feedings. Try to relax when it is time to feed your baby. This helps to trigger your "let-down reflex," which releases milk from your breast.  Ensure your baby is latched on to your breast and positioned properly while breastfeeding.  Allow your baby to remain at your breast as long as he or she is latched on well and actively sucking. Your baby will let you know when he or she is done breastfeeding by pulling away from your breast  or falling asleep.  Avoid introducing bottles or pacifiers to your baby in the early weeks of breastfeeding. Wait to introduce these things until after resolving any breastfeeding challenges.  Try to pump your milk on the same schedule as when your baby would breastfeed if you are returning to work or away from home for an extended period.  Drink plenty of fluids to avoid dehydration, which can eventually put you at greater risk of breast engorgement.  If you follow these suggestions, your engorgement should improve in 24-48 hours. If you are still experiencing difficulty, call your lactation consultant or health care provider. Challenge--Plugged Milk Ducts Plugged milk ducts occur when the duct does not drain milk effectively and becomes swollen. Wearing a tight-fitting nursing bra or having difficulty with latching may cause plugged milk ducts. Not drinking enough water (8-10 c [1.9-2.4 L] per day) can contribute to plugged milk ducts. Once a duct has become plugged, hard lumps, soreness, and redness may develop in your breast. Solution Do not delay feedings. Feed your baby frequently and try to empty your breasts of milk at each feeding. Try  breastfeeding from the affected side first so there is a better chance that the milk will drain completely from that breast. Apply warm, moist towels to your breasts for 5-10 minutes before feeding. Alternatively, a hot shower right before breastfeeding can provide the moist heat that can encourage milk flow. Gentle massage of the sore area before and during a feeding may also help. Avoid wearing tight clothing or bras that put pressure on your breasts. Wear bras that offer good support to your breasts, but avoid underwire bras. If you have a plugged milk duct and develop a fever, you need to see your health care provider. Challenge--Mastitis Mastitis is inflammation of your breast. It usually is caused by a bacterial infection and can cause flu-like symptoms. You may develop redness in your breast and a fever. Often when mastitis occurs, your breast becomes firm, warm, and very painful. The most common causes of mastitis are poor latching, ineffective sucking from your baby, consistent pressure on your breast (possibly from wearing a tight-fitting bra or shirt that restricts the milk flow), unusual stress or fatigue, or missed feedings. Solution You will be given antibiotic medicine to treat the infection. It is still important to breastfeed frequently to empty your breasts. Continuing to breastfeed while you recover from mastitis will not harm your baby. Make sure your baby is positioned properly during every feeding. Apply moist heat to your breasts for a few minutes before feeding to help the milk flow and to help your breasts empty more easily. Challenge--Thrush Amy Young is a yeast infection that can form on your nipples, in your breast, or in your baby's mouth. It causes itching, soreness, burning or stabbing pain, and sometimes a rash. Solution You will be given a medicated ointment for your nipples, and your baby will be given a liquid medicine for his or her mouth. It is important that you and your  baby are treated at the same time because thrush can be passed between you and your baby. Change disposable nursing pads often. Any bras, towels, or clothing that come in contact with infected areas of your body or your baby's body need to be washed in very hot water every day. Wash your hands and your baby's hands often. All pacifiers, bottle nipples, or toys your baby puts in his or her mouth should be boiled once a day for 20 minutes. After 1  week of treatment, discard pacifiers and bottle nipples and buy new ones. All breast pump parts that touch the milk need to be boiled for 20 minutes every day. Challenge--Low Milk Supply You may not be producing enough milk if your baby is not gaining the proper amount of weight. Breast milk production is based on a supply-and-demand system. Your milk supply depends on how frequently and effectively your baby empties your breast. Solution The more you breastfeed and pump, the more breast milk you will produce. It is important that your baby empties at least one of your breasts at each feeding. If this is not happening, then use a breast pump or hand express any milk that remains. This will help to drain as much milk as possible at each feeding. It will also signal your body to produce more milk. If your baby is not emptying your breasts, it may be due to latching, sucking, or positioning problems. If low milk supply continues after addressing these issues, contact your health care provider or a lactation specialist as soon as possible. Challenge--Inverted or Flat Nipples Some women have nipples that turn inward instead of protruding outward. Other women have nipples that are flat. Inverted or flat nipples can sometimes make it more difficult for your baby to latch onto your breast. Solution You may be given a small device that pulls out inverted nipples. This device should be applied right before your baby is brought to your breast. You can also try using a breast  pump for a short time before placing the baby at your breast. The pump can pull your nipple outwards to help your infant latch more easily. The baby's sucking motion will help the inverted nipple protrude as well. If you have flat nipples, encourage your baby to latch onto your breast and feed frequently in the early days after birth. This will give your baby practice latching on correctly while your breast is still soft. When your milk supply increases, between the second and fifth day after birth and your breasts become full, your baby will have an easier time latching. Contact a lactation consultant if you still have concerns. She or he can teach you additional techniques to address breastfeeding problems related to nipple shape and position. Where to find more information: [employer]: www.llli.org This information is not intended to replace advice given to you by your health care provider. Make sure you discuss any questions you have with your health care provider. Document Released: 08/29/2005 Document Revised: 08/19/2015 Document Reviewed: 08/31/2012 Elsevier Interactive Patient Education  2017 [employer]  Home Care Instructions for Mom ACTIVITY  Gradually return to your regular activities.  Let yourself rest. Nap while your baby sleeps.  Avoid lifting anything that is heavier than 10 lb (4.5 kg) until your health care provider says it is okay.  Avoid activities that take a lot of effort and energy (are strenuous) until approved by your health care provider. Walking at a slow-to-moderate pace is usually safe.  If you had a cesarean delivery: ? Do not vacuum, climb stairs, or drive a car for 4-6 weeks. ? Have someone help you at home until you feel like you can do your usual activities yourself. ? Do exercises as told by your health care provider, if this applies.  VAGINAL BLEEDING You may continue to bleed for 4-6 weeks after delivery. Over time, the amount of  blood usually decreases and the color of the blood usually gets lighter. However, the flow of  bright red blood may increase if you have been too active. If you need to use more than one pad in an hour because your pad gets soaked, or if you pass a large clot:  Lie down.  Raise your feet.  Place a cold compress on your lower abdomen.  Rest.  Call your health care provider.  If you are breastfeeding, your period should return anytime between 8 weeks after delivery and the time that you stop breastfeeding. If you are not breastfeeding, your period should return 6-8 weeks after delivery. PERINEAL CARE The perineal area, or perineum, is the part of your body between your thighs. After delivery, this area needs special care. Follow these instructions as told by your health care provider.  Take warm tub baths for 15-20 minutes.  Use medicated pads and pain-relieving sprays and creams as told.  Do not use tampons or douches until vaginal bleeding has stopped.  Each time you go to the bathroom: ? Use a peri bottle. ? Change your pad. ? Use towelettes in place of toilet paper until your stitches have healed.  Do Kegel exercises every day. Kegel exercises help to maintain the muscles that support the vagina, bladder, and bowels. You can do these exercises while you are standing, sitting, or lying down. To do Kegel exercises: ? Tighten the muscles of your abdomen and the muscles that surround your birth canal. ? Hold for a few seconds. ? Relax. ? Repeat until you have done this 5 times in a row.  To prevent hemorrhoids from developing or getting worse: ? Drink enough fluid to keep your urine clear or pale yellow. ? Avoid straining when having a bowel movement. ? Take over-the-counter medicines and stool softeners as told by your health care provider.  BREAST CARE  Wear a tight-fitting bra.  Avoid taking over-the-counter pain medicine for breast discomfort.  Apply ice to the breasts to  help with discomfort as needed: ? Put ice in a plastic bag. ? Place a towel between your skin and the bag. ? Leave the ice on for 20 minutes or as told by your health care provider.  NUTRITION  Eat a well-balanced diet.  Do not try to lose weight quickly by cutting back on calories.  Take your prenatal vitamins until your postpartum checkup or until your health care provider tells you to stop.  POSTPARTUM DEPRESSION You may find yourself crying for no apparent reason and unable to cope with all of the changes that come with having a newborn. This mood is called postpartum depression. Postpartum depression happens because your hormone levels change after delivery. If you have postpartum depression, get support from your partner, friends, and family. If the depression does not go away on its own after several weeks, contact your health care provider. BREAST SELF-EXAM Do a breast self-exam each month, at the same time of the month. If you are breastfeeding, check your breasts just after a feeding, when your breasts are less full. If you are breastfeeding and your period has started, check your breasts on day 5, 6, or 7 of your period. Report any lumps, bumps, or discharge to your health care provider. Know that breasts are normally lumpy if you are breastfeeding. This is temporary, and it is not a health risk. INTIMACY AND SEXUALITY Avoid sexual activity for at least 3-4 weeks after delivery or until the brownish-red vaginal flow is completely gone. If you want to avoid pregnancy, use some form of birth control. You can get  pregnant after delivery, even if you have not had your period. SEEK MEDICAL CARE IF:  You feel unable to cope with the changes that a child brings to your life, and these feelings do not go away after several weeks.  You notice a lump, a bump, or discharge on your breast.  SEEK IMMEDIATE MEDICAL CARE IF:  Blood soaks your pad in 1 hour or less.  You have: ? Severe pain  or cramping in your lower abdomen. ? A bad-smelling vaginal discharge. ? A fever that is not controlled by medicine. ? A fever, and an area of your breast is red and sore. ? Pain or redness in your calf. ? Sudden, severe chest pain. ? Shortness of breath. ? Painful or bloody urination. ? Problems with your vision.  You vomit for 12 hours or longer.  You develop a severe headache.  You have serious thoughts about hurting yourself, your child, or anyone else.  This information is not intended to replace advice given to you by your health care provider. Make sure you discuss any questions you have with your health care provider. Document Released: 03/04/2000 Document Revised: 08/13/2015 Document Reviewed: 09/08/2014 Elsevier Interactive Patient Education  2017 [employer].

## 2017-02-03 ENCOUNTER — Inpatient Hospital Stay (HOSPITAL_COMMUNITY): Payer: Medicaid Other

## 2017-11-15 ENCOUNTER — Emergency Department (HOSPITAL_BASED_OUTPATIENT_CLINIC_OR_DEPARTMENT_OTHER)
Admission: EM | Admit: 2017-11-15 | Discharge: 2017-11-15 | Disposition: A | Discharge status: Home / Self Care | Payer: Medicaid Other | Attending: Emergency Medicine | Admitting: Emergency Medicine

## 2017-11-15 ENCOUNTER — Encounter (HOSPITAL_BASED_OUTPATIENT_CLINIC_OR_DEPARTMENT_OTHER): Payer: Self-pay

## 2017-11-15 ENCOUNTER — Other Ambulatory Visit: Payer: Self-pay

## 2017-11-15 DIAGNOSIS — Z79899 Other long term (current) drug therapy: Secondary | ICD-10-CM | POA: Insufficient documentation

## 2017-11-15 DIAGNOSIS — N3000 Acute cystitis without hematuria: Secondary | ICD-10-CM | POA: Insufficient documentation

## 2017-11-15 DIAGNOSIS — R1012 Left upper quadrant pain: Secondary | ICD-10-CM | POA: Insufficient documentation

## 2017-11-15 LAB — PREGNANCY, URINE: Preg Test, Ur: NEGATIVE

## 2017-11-15 LAB — COMPREHENSIVE METABOLIC PANEL
ALT: 12 U/L (ref 0–44)
AST: 15 U/L (ref 15–41)
Albumin: 4.6 g/dL (ref 3.5–5.0)
Alkaline Phosphatase: 46 U/L (ref 38–126)
Anion gap: 11 (ref 5–15)
BUN: 11 mg/dL (ref 6–20)
CHLORIDE: 103 mmol/L (ref 98–111)
CO2: 26 mmol/L (ref 22–32)
CREATININE: 0.62 mg/dL (ref 0.44–1.00)
Calcium: 8.9 mg/dL (ref 8.9–10.3)
GFR calc non Af Amer: >60 mL/min (ref >60)
Glucose, Bld: 91 mg/dL (ref 70–99)
Potassium: 3.8 mmol/L (ref 3.5–5.1)
SODIUM: 140 mmol/L (ref 135–145)
Total Bilirubin: 0.7 mg/dL (ref 0.3–1.2)
Total Protein: 8.1 g/dL (ref 6.5–8.1)

## 2017-11-15 LAB — URINALYSIS, MICROSCOPIC (REFLEX): WBC, UA: >50 WBC/hpf (ref 0–5)

## 2017-11-15 LAB — URINALYSIS, ROUTINE W REFLEX MICROSCOPIC
BILIRUBIN URINE: NEGATIVE
Glucose, UA: NEGATIVE mg/dL
KETONES UR: 15 mg/dL — AB
Nitrite: NEGATIVE
PH: 6 (ref 5.0–8.0)
PROTEIN: NEGATIVE mg/dL
Specific Gravity, Urine: <1.005 — ABNORMAL LOW (ref 1.005–1.030)

## 2017-11-15 LAB — CBC WITH DIFFERENTIAL/PLATELET
BASOS PCT: 0 %
Basophils Absolute: 0 10*3/uL (ref 0.0–0.1)
EOS ABS: 0 10*3/uL (ref 0.0–0.7)
Eosinophils Relative: 1 %
HCT: 37.5 % (ref 36.0–46.0)
HEMOGLOBIN: 12.6 g/dL (ref 12.0–15.0)
LYMPHS ABS: 1.6 10*3/uL (ref 0.7–4.0)
Lymphocytes Relative: 23 %
MCH: 30.6 pg (ref 26.0–34.0)
MCHC: 33.6 g/dL (ref 30.0–36.0)
MCV: 91 fL (ref 78.0–100.0)
MONOS PCT: 10 %
Monocytes Absolute: 0.7 10*3/uL (ref 0.1–1.0)
NEUTROS ABS: 4.8 10*3/uL (ref 1.7–7.7)
NEUTROS PCT: 66 %
Platelets: 174 10*3/uL (ref 150–400)
RBC: 4.12 MIL/uL (ref 3.87–5.11)
RDW: 12.3 % (ref 11.5–15.5)
WBC: 7.2 10*3/uL (ref 4.0–10.5)

## 2017-11-15 LAB — LIPASE, BLOOD: LIPASE: 25 U/L (ref 11–51)

## 2017-11-15 MED ORDER — CEPHALEXIN 500 MG PO CAPS: 500.0000 mg | ORAL_CAPSULE | Freq: Three times a day (TID) | ORAL | 0 refills | Status: DC

## 2017-11-15 NOTE — Discharge Instructions (Signed)
Take Keflex three times daily for 10 days Take Ibuprofen for pain and chills Please return if worsening (fever, vomiting, severe pain)

## 2017-11-15 NOTE — ED Triage Notes (Signed)
Per husband/interpreter-pt with dysuria, vaginal itching, left side abd pain-sx started x 2 weeks-NAD-steady gait

## 2017-11-15 NOTE — ED Provider Notes (Signed)
[facility] EMERGENCY DEPARTMENT Provider Note   CSN: [id number] Arrival date & time: 11/15/17  1225     History   Chief Complaint Chief Complaint  Patient presents with  . Dysuria    HPI Amy Young is a 27 y.o. female who presents with dysuria and R flank pain. No significant PMH. Her husband is at bedside and translates. History is limited due to language barrier. She has had dysuria for the past 2 weeks. It has progressively worsened to the point where now she has L flank pain and LUQ pain. She reports chills but no fever. No chest pain, SOB, cough. No vaginal discharge. No vomiting.  HPI  Past Medical History:  Diagnosis Date  . Medical history non-contributory     Patient Active Problem List   Diagnosis Date Noted  . VBAC, delivered 01/29/2017  . Normal labor 01/28/2017    Past Surgical History:  Procedure Laterality Date  . CESAREAN SECTION       OB History    Gravida  2   Para  2   Term  2   Preterm      AB      Living  2     SAB      TAB      Ectopic      Multiple  0   Live Births  1            Home Medications    Prior to Admission medications   Medication Sig Start Date End Date Taking? Authorizing Provider  ibuprofen (ADVIL,MOTRIN) 600 MG tablet Take 1 tablet (600 mg total) every 6 (six) hours by mouth. 01/29/17   Minott, Lynnae PrudeKeriann S, MD  Prenatal Vit-Fe Fumarate-FA (PRENATAL MULTIVITAMIN) TABS tablet Take 1 tablet daily at 12 noon by mouth.    [provider]  senna-docusate (SENOKOT-S) 8.6-50 MG tablet Take 2 tablets daily by mouth. 01/30/17   Minott, Lynnae PrudeKeriann S, MD    Family History No family history on file.  Social History Social History   Tobacco Use  . Smoking status: Never Smoker  . Smokeless tobacco: Never Used  Substance Use Topics  . Alcohol use: No  . Drug use: No     Allergies   Patient has no known allergies.   Review of Systems Review of Systems  Constitutional: Positive for  chills. Negative for fever.  Respiratory: Negative for cough and shortness of breath.   Cardiovascular: Negative for chest pain.  Gastrointestinal: Positive for abdominal pain. Negative for diarrhea, nausea and vomiting.  Genitourinary: Positive for dysuria and flank pain. Negative for pelvic pain, vaginal bleeding and vaginal discharge.  All other systems reviewed and are negative.    Physical Exam Updated Vital Signs BP 110/71 (BP Location: Left Arm)   Pulse 88   Temp 98.3 F (36.8 C) (Oral)   Resp 18   Wt 57.2 kg   LMP 11/05/2017   SpO2 99%   BMI 20.34 kg/m   Physical Exam  Constitutional: She is oriented to person, place, and time. She appears well-developed and well-nourished. No distress.  Calm, cooperative. Well appearing  HENT:  Head: Normocephalic and atraumatic.  Eyes: Pupils are equal, round, and reactive to light. Conjunctivae are normal. Right eye exhibits no discharge. Left eye exhibits no discharge. No scleral icterus.  Neck: Normal range of motion.  Cardiovascular: Normal rate and regular rhythm.  Pulmonary/Chest: Effort normal and breath sounds normal. No respiratory distress.  Abdominal: Soft. Bowel sounds are normal.  She exhibits no distension and no mass. There is tenderness (LUQ tenderness and L CVA tenderness). There is no rebound and no guarding. No hernia.  Neurological: She is alert and oriented to person, place, and time.  Skin: Skin is warm and dry.  Psychiatric: She has a normal mood and affect. Her behavior is normal.  Nursing note and vitals reviewed.    ED Treatments / Results  Labs (all labs ordered are listed, but only abnormal results are displayed) Labs Reviewed  URINALYSIS, ROUTINE W REFLEX MICROSCOPIC - Abnormal; Notable for the following components:      Result Value   APPearance CLOUDY (*)    Specific Gravity, Urine <1.005 (*)    Hgb urine dipstick TRACE (*)    Ketones, ur 15 (*)    Leukocytes, UA LARGE (*)    All other  components within normal limits  URINALYSIS, MICROSCOPIC (REFLEX) - Abnormal; Notable for the following components:   Bacteria, UA MANY (*)    All other components within normal limits  URINE CULTURE  PREGNANCY, URINE  CBC WITH DIFFERENTIAL/PLATELET  LIPASE, BLOOD  COMPREHENSIVE METABOLIC PANEL    EKG None  Radiology No results found.  Procedures Procedures (including critical care time)  Medications Ordered in ED Medications - No data to display   Initial Impression / Assessment and Plan / ED Course  I have reviewed the triage vital signs and the nursing notes.  Pertinent labs & imaging results that were available during my care of the patient were reviewed by me and considered in my medical decision making (see chart for details).  27 year old female presents with dysuria and LUQ/L flank pain for 2 weeks. Her vitals are normal. She is well appearing. She has LUQ and L CVA tenderness on exam. CBC is normal. CMP and lipase are normal. UA shows trace hgb, 15 ketones, large leukocytes, many bacteria, >50 WBC. Preg test is negative. Discussed with pt and husband. Will tx clinically for pyelo. Return precautions given.  Final Clinical Impressions(s) / ED Diagnoses   Final diagnoses:  Acute cystitis without hematuria    ED Discharge Orders    None       Bethel Born, PA-C 11/15/17 1622    Melene Plan, DO 11/16/17 [phone]

## 2017-11-17 LAB — URINE CULTURE: Culture: >=100000 — AB

## 2017-11-18 ENCOUNTER — Telehealth: Payer: Self-pay

## 2017-11-18 NOTE — Progress Notes (Signed)
ED Antimicrobial Stewardship Positive Culture Follow Up   Amy Young is an 27 y.o. female who [facility] with a chief complaint of  Chief Complaint  Patient presents with  . Dysuria    Recent Results (from the past 720 hour(s))  Urine culture     Status: Abnormal   Collection Time: 11/15/17 12:45 PM  Result Value Ref Range Status   Specimen Description   Final    URINE, RANDOM Performed at [facility] [place], [address] [place], [place] [address]    Special Requests   Final    NONE Performed at [facility], [address] [place], [place] [address]    Culture >=100,000 COLONIES/mL STAPHYLOCOCCUS SAPROPHYTICUS (A)  Final   Report Status 11/17/2017 FINAL  Final   Organism ID, Bacteria STAPHYLOCOCCUS SAPROPHYTICUS (A)  Final      Susceptibility   Staphylococcus saprophyticus - MIC*    CIPROFLOXACIN <=0.5 SENSITIVE Sensitive     GENTAMICIN <=0.5 SENSITIVE Sensitive     NITROFURANTOIN <=16 SENSITIVE Sensitive     OXACILLIN 2 RESISTANT Resistant     TETRACYCLINE <=1 SENSITIVE Sensitive     VANCOMYCIN 2 SENSITIVE Sensitive     TRIMETH/SULFA <=10 SENSITIVE Sensitive     CLINDAMYCIN <=0.25 SENSITIVE Sensitive     RIFAMPIN <=0.5 SENSITIVE Sensitive     Inducible Clindamycin NEGATIVE Sensitive     * >=100,000 COLONIES/mL STAPHYLOCOCCUS SAPROPHYTICUS    [x]  Treated with cephalexin, organism resistant to prescribed antimicrobial []  Patient discharged originally without antimicrobial agent and treatment is now indicated  New antibiotic prescription: DC cephalexin, start macrobid 100mg  PO BID x 5 days  ED Provider: Harlene SaltsBrandon Morelli, PA   [name], Drake LeachRachel Lynn 11/18/2017, 9:25 AM Clinical Pharmacist Monday - Friday phone -  [phone] Saturday - Sunday phone - [phone]

## 2017-11-18 NOTE — Telephone Encounter (Signed)
Post ED Visit - Positive Culture Follow-up: Successful Patient Follow-Up  Culture assessed and recommendations reviewed by:  []  Enzo BiNathan Batchelder, Pharm.D. []  Celedonio MiyamotoJeremy Frens, Pharm.D., BCPS AQ-ID []  Garvin FilaMike Maccia, Pharm.D., BCPS []  Georgina PillionElizabeth Martin, Pharm.D., BCPS []  [place]h Pham, [place].D., BCPS, AAHIVP []  Estella HuskMichelle Turner, Pharm.D., BCPS, AAHIVP [x]  Lysle Pearlachel Rumbarger, PharmD, BCPS []  Phillips Climeshuy Dang, PharmD, BCPS []  Agapito GamesAlison Masters, PharmD, BCPS []  Verlan FriendsErin Deja, PharmD  Positive urine culture  []  Patient discharged without antimicrobial prescription and treatment is now indicated [x]  Organism is resistant to prescribed ED discharge antimicrobial []  Patient with positive blood cultures  Changes discussed with ED provider: Harlene SaltsBrandon Morelli [facility] New antibiotic prescription Macrobid  100 mg BID x 5 days [facility] [address] [phone]  Contacted patient, date 11/18/17, time 1003   Delilah Mulgrew, Linnell FullingRose Burnett 11/18/2017, 10:02 AM

## 2017-12-07 ENCOUNTER — Emergency Department (HOSPITAL_BASED_OUTPATIENT_CLINIC_OR_DEPARTMENT_OTHER): Payer: Self-pay

## 2017-12-07 ENCOUNTER — Encounter (HOSPITAL_BASED_OUTPATIENT_CLINIC_OR_DEPARTMENT_OTHER): Payer: Self-pay | Admitting: Emergency Medicine

## 2017-12-07 ENCOUNTER — Emergency Department (HOSPITAL_BASED_OUTPATIENT_CLINIC_OR_DEPARTMENT_OTHER)
Admission: EM | Admit: 2017-12-07 | Discharge: 2017-12-07 | Disposition: A | Discharge status: Home / Self Care | Payer: Self-pay | Attending: Emergency Medicine | Admitting: Emergency Medicine

## 2017-12-07 ENCOUNTER — Other Ambulatory Visit: Payer: Self-pay

## 2017-12-07 DIAGNOSIS — N3000 Acute cystitis without hematuria: Secondary | ICD-10-CM | POA: Insufficient documentation

## 2017-12-07 LAB — COMPREHENSIVE METABOLIC PANEL
ALBUMIN: 4.3 g/dL (ref 3.5–5.0)
ALT: 13 U/L (ref 0–44)
ANION GAP: 5 (ref 5–15)
AST: 15 U/L (ref 15–41)
Alkaline Phosphatase: 40 U/L (ref 38–126)
BUN: 17 mg/dL (ref 6–20)
CHLORIDE: 110 mmol/L (ref 98–111)
CO2: 25 mmol/L (ref 22–32)
Calcium: 8.8 mg/dL — ABNORMAL LOW (ref 8.9–10.3)
Creatinine, Ser: 0.57 mg/dL (ref 0.44–1.00)
GFR calc Af Amer: >60 mL/min (ref >60)
GFR calc non Af Amer: >60 mL/min (ref >60)
GLUCOSE: 95 mg/dL (ref 70–99)
POTASSIUM: 4 mmol/L (ref 3.5–5.1)
SODIUM: 140 mmol/L (ref 135–145)
TOTAL PROTEIN: 7.4 g/dL (ref 6.5–8.1)
Total Bilirubin: 0.7 mg/dL (ref 0.3–1.2)

## 2017-12-07 LAB — URINALYSIS, ROUTINE W REFLEX MICROSCOPIC
Bilirubin Urine: NEGATIVE
Glucose, UA: NEGATIVE mg/dL
Hgb urine dipstick: NEGATIVE
KETONES UR: NEGATIVE mg/dL
NITRITE: NEGATIVE
PROTEIN: NEGATIVE mg/dL
Specific Gravity, Urine: 1.025 (ref 1.005–1.030)
pH: 6 (ref 5.0–8.0)

## 2017-12-07 LAB — CBC WITH DIFFERENTIAL/PLATELET
BASOS PCT: 0 %
Basophils Absolute: 0 10*3/uL (ref 0.0–0.1)
EOS ABS: 0 10*3/uL (ref 0.0–0.7)
Eosinophils Relative: 1 %
HCT: 36.2 % (ref 36.0–46.0)
Hemoglobin: 12.4 g/dL (ref 12.0–15.0)
Lymphocytes Relative: 19 %
Lymphs Abs: 1.1 10*3/uL (ref 0.7–4.0)
MCH: 30.7 pg (ref 26.0–34.0)
MCHC: 34.3 g/dL (ref 30.0–36.0)
MCV: 89.6 fL (ref 78.0–100.0)
MONO ABS: 0.6 10*3/uL (ref 0.1–1.0)
MONOS PCT: 10 %
Neutro Abs: 4 10*3/uL (ref 1.7–7.7)
Neutrophils Relative %: 70 %
PLATELETS: 162 10*3/uL (ref 150–400)
RBC: 4.04 MIL/uL (ref 3.87–5.11)
RDW: 12.1 % (ref 11.5–15.5)
WBC: 5.7 10*3/uL (ref 4.0–10.5)

## 2017-12-07 LAB — URINALYSIS, MICROSCOPIC (REFLEX)

## 2017-12-07 LAB — LIPASE, BLOOD: Lipase: 27 U/L (ref 11–51)

## 2017-12-07 LAB — PREGNANCY, URINE: PREG TEST UR: NEGATIVE

## 2017-12-07 MED ORDER — CLINDAMYCIN HCL 150 MG PO CAPS: 450.0000 mg | ORAL_CAPSULE | Freq: Three times a day (TID) | ORAL | 0 refills | Status: AC

## 2017-12-07 MED ORDER — ACETAMINOPHEN 325 MG PO TABS
650.0000 mg | ORAL_TABLET | Freq: Once | ORAL | Status: AC
  Administered 2017-12-07: 650 mg via ORAL
  Filled 2017-12-07: qty 2

## 2017-12-07 MED FILL — CLINDAMYCIN HCL 150 MG CAPS: 150 | 7 days supply | Qty: 63 | Fill #0

## 2017-12-07 NOTE — Discharge Instructions (Addendum)
You were given a prescription for antibiotics. Please take the antibiotic prescription fully.   Please follow up with your primary care provider within 5-7 days for re-evaluation of your symptoms. If you do not have a primary care provider, information for a healthcare clinic has been provided for you to make arrangements for follow up care. Please return to the emergency department for any new or worsening symptoms.  

## 2017-12-07 NOTE — ED Triage Notes (Signed)
Pt c/o continued LT flank pain; per husband, who is translating, pt was seen and treated recently for dysuria and "yeast infection" ; pt also reports middle abd pain that they forgot to mention at the last visit

## 2017-12-07 NOTE — ED Provider Notes (Signed)
[facility] EMERGENCY DEPARTMENT Provider Note   CSN: [id number] Arrival date & time: 12/07/17  4098     History   Chief Complaint Chief Complaint  Patient presents with  . Flank Pain   History limited due to language barrier.  Patient present with her husband who acts as interpreter.  Offered interpreter multiple times and patient and family refused.  HPI Amy Young is a 27 y.o. female.  HPI   Patient is a 27 year old female with no significant past medical history presents emergency department today for evaluation of left-sided flank pain that has been ongoing for the last 3 weeks.  Pain is constant and severe in nature.  Has tried taking ibuprofen with no significant relief.  Patient was seen in the ED on 11/16/2017 and was diagnosed clinically with pyelonephritis.  She was started on Keflex which was later changed to nitrofurantoin.  States she completed the course of antibiotics and her urinary symptoms including dysuria, urinary frequency improved.  Denies hematuria.  Since completing antibiotic she has continued to have the persistent left-sided flank pain.  Reports associated chills, but denies fevers, nausea, vomiting, diarrhea, constipation, bloody stools, vaginal bleeding, vaginal discharge or other genitourinary complaints.  Past Medical History:  Diagnosis Date  . Medical history non-contributory     Patient Active Problem List   Diagnosis Date Noted  . VBAC, delivered 01/29/2017  . Normal labor 01/28/2017    Past Surgical History:  Procedure Laterality Date  . CESAREAN SECTION       OB History    Gravida  2   Para  2   Term  2   Preterm      AB      Living  2     SAB      TAB      Ectopic      Multiple  0   Live Births  1            Home Medications    Prior to Admission medications   Medication Sig Start Date End Date Taking? Authorizing Provider  cephALEXin (KEFLEX) 500 MG capsule Take 1 capsule (500 mg total) by  mouth 3 (three) times daily. 11/15/17   Bethel Born, PA-C  clindamycin (CLEOCIN) 150 MG capsule Take 3 capsules (450 mg total) by mouth 3 (three) times daily for 7 days. 12/07/17 12/14/17  Jamear Carbonneau S, PA-C  ibuprofen (ADVIL,MOTRIN) 600 MG tablet Take 1 tablet (600 mg total) every 6 (six) hours by mouth. 01/29/17   Minott, Lynnae Prude, MD  Prenatal Vit-Fe Fumarate-FA (PRENATAL MULTIVITAMIN) TABS tablet Take 1 tablet daily at 12 noon by mouth.    [provider]  senna-docusate (SENOKOT-S) 8.6-50 MG tablet Take 2 tablets daily by mouth. 01/30/17   Minott, Lynnae Prude, MD    Family History No family history on file.  Social History Social History   Tobacco Use  . Smoking status: Never Smoker  . Smokeless tobacco: Never Used  Substance Use Topics  . Alcohol use: No  . Drug use: No     Allergies   Patient has no known allergies.   Review of Systems Review of Systems  Constitutional: Positive for chills. Negative for fever.  HENT: Negative for sore throat.   Eyes: Negative for visual disturbance.  Respiratory: Negative for cough and shortness of breath.   Cardiovascular: Negative for chest pain.  Gastrointestinal: Positive for abdominal pain. Negative for blood in stool, constipation, diarrhea, nausea and vomiting.  Genitourinary:  Positive for flank pain. Negative for dysuria, frequency, hematuria, urgency, vaginal bleeding and vaginal discharge.  Musculoskeletal: Negative for myalgias.  Skin: Negative for rash.  Neurological: Negative for headaches.     Physical Exam Updated Vital Signs BP 109/65   Pulse 64   Temp 98.3 F (36.8 C) (Oral)   Resp 18   Ht 5\' 6"  (1.676 m)   Wt 55 kg   LMP 11/06/2017 Comment: Negative U-preg today  SpO2 100%   Breastfeeding? Yes   BMI 19.57 kg/m   Physical Exam  Constitutional: She appears well-developed and well-nourished. No distress.  Nontoxic-appearing no acute distress.  HENT:  Head: Normocephalic and atraumatic.   Eyes: Conjunctivae are normal. No scleral icterus.  Neck: Neck supple.  Cardiovascular: Normal rate, regular rhythm and normal heart sounds.  No murmur heard. Pulmonary/Chest: Effort normal and breath sounds normal. No stridor. No respiratory distress. She has no wheezes.  Abdominal: Soft. Bowel sounds are normal. She exhibits no distension. There is no tenderness. There is no guarding.  Left CVA TTP.   Musculoskeletal: She exhibits no edema.  Neurological: She is alert.  Skin: Skin is warm and dry.  Psychiatric: She has a normal mood and affect.  Nursing note and vitals reviewed.    ED Treatments / Results  Labs (all labs ordered are listed, but only abnormal results are displayed) Labs Reviewed  COMPREHENSIVE METABOLIC PANEL - Abnormal; Notable for the following components:      Result Value   Calcium 8.8 (*)    All other components within normal limits  URINALYSIS, ROUTINE W REFLEX MICROSCOPIC - Abnormal; Notable for the following components:   Leukocytes, UA SMALL (*)    All other components within normal limits  URINALYSIS, MICROSCOPIC (REFLEX) - Abnormal; Notable for the following components:   Bacteria, UA MANY (*)    All other components within normal limits  URINE CULTURE  CBC WITH DIFFERENTIAL/PLATELET  LIPASE, BLOOD  PREGNANCY, URINE    EKG None  Radiology Ct Renal Stone Study  Result Date: 12/07/2017 CLINICAL DATA:  Left flank pain for 3 weeks, dysuria, no history of kidney stones EXAM: CT ABDOMEN AND PELVIS WITHOUT CONTRAST TECHNIQUE: Multidetector CT imaging of the abdomen and pelvis was performed following the standard protocol without IV contrast. COMPARISON:  None. FINDINGS: Lower chest: The lung bases are clear. The heart is within normal limits in size. Hepatobiliary: The liver is unremarkable in the unenhanced state. No calcified gallstones are seen. Pancreas: The pancreas is normal in size and the pancreatic duct is not dilated. Spleen: The spleen is  unremarkable. Adrenals/Urinary Tract: The adrenal glands appear normal. No renal calculi are seen and there is no evidence of hydronephrosis. The ureters appear normal in caliber. The urinary bladder is not well distended but no abnormality is seen. Stomach/Bowel: The stomach is decompressed. No abnormality is seen. No definite abnormality of small bowel is seen. There is slight distention of a loop of small bowel in the left upper quadrant which is nonspecific and can be seen with enteritis. There is some feces throughout the transverse colon. No colonic abnormality is seen. Much of the colon is decompressed however. The terminal ileum is unremarkable. The appendix is retrocecal and unremarkable extending cephalad. Vascular/Lymphatic: The abdominal aorta is normal in caliber. No adenopathy is seen with small nodes present within the right lower quadrant. Reproductive: The uterus is normal in size. No adnexal lesion is seen other than probable small follicles. There is some free fluid layering dependently within  the pelvis which may be due to a recently ruptured ovarian cyst. Other: No abdominal wall hernia is seen. Musculoskeletal: The lumbar vertebrae are normal alignment with normal intervertebral disc spaces. The SI joints appear corticated. IMPRESSION: 1. No definite explanation for the patient's pain is seen. There are no renal or ureteral calculi noted. 2. Slightly prominent small bowel loops in the left upper quadrant are nonspecific but may indicate enteritis. Correlate clinically. 3. Small amount of free fluid in the pelvis possibly due to a recently ruptured ovarian cyst. Electronically Signed   By: Dwyane Dee M.D.   On: 12/07/2017 12:01    Procedures Procedures (including critical care time)  Medications Ordered in ED Medications  acetaminophen (TYLENOL) tablet 650 mg (650 mg Oral Given 12/07/17 1055)     Initial Impression / Assessment and Plan / ED Course  I have reviewed the triage vital  signs and the nursing notes.  Pertinent labs & imaging results that were available during my care of the patient were reviewed by me and considered in my medical decision making (see chart for details).   1:15 PM CONSULT with Christin Fudge with pharmacy at [facility].  Discussed patient case and most recent urine culture that grew out staph saprophyticus that was sensitive to clindamycin.  Given the patient is breast-feeding, advised to give clindamycin 450 3 times daily for 7 to 10 days to treat suspected pyelonephritis.  Final Clinical Impressions(s) / ED Diagnoses   Final diagnoses:  Acute cystitis without hematuria   Patient presenting with persistent left flank pain.  Was seen in the ED on 11/16/2017 and diagnosed with pyelonephritis.  She was started on Keflex at that time and urine culture grew out an organism that was resistant to this but.  She was changed to [facility] which she completed.  She has had persistent left flank pain since then however her urinary symptoms have grossly resolved.  Today she is nontoxic and nonseptic appearing.  She is afebrile.  Labs with no leukocytosis.  Normal lecture lites normal creatinine.  Normal liver enzymes normal lipase.  UA is consistent with persistent urinary tract infection.  Urine culture was sent.  Consulted with pharmacy who stated that Macrobid would not concentrate systemically to cover pyelonephritis.  He suggested starting the patient on clindamycin to cover for this.  CT renal study did not show any evidence of nephrolithiasis.  Did show some dilated small bowel loops in the left upper quadrant that are nonspecific and could represent enteritis, though clinically I have lower suspicion for this as she has had no GI complaints along with her symptoms.  She also has no tenderness to the left upper quadrant on exam.  CT also noted a small amount of free fluid in the pelvis which could be due to a recently ruptured ovarian cyst.  I do not  suspect that this is the cause of her symptoms, and have more of a suspicion for persistent pyelonephritis based on her urinalysis and physical exam today.  Discussed findings with the patient and her husband at bedside discussed plan to start her on kanamycin.  Advised to have her follow-up with PCP and to return to the ER for any new or worsening symptoms in the meantime.  Husband voices understanding of plan reasons to return.  All questions answered.  ED Discharge Orders         Ordered    clindamycin (CLEOCIN) 150 MG capsule  3 times daily     12/07/17  [address], PA-C 12/07/17 1521    Virgina Norfolk, DO 12/07/17 1610

## 2017-12-08 LAB — URINE CULTURE: CULTURE: NO GROWTH

## 2018-01-10 ENCOUNTER — Other Ambulatory Visit: Payer: Self-pay

## 2018-01-10 ENCOUNTER — Encounter (HOSPITAL_BASED_OUTPATIENT_CLINIC_OR_DEPARTMENT_OTHER): Payer: Self-pay | Admitting: *Deleted

## 2018-01-10 ENCOUNTER — Emergency Department (HOSPITAL_BASED_OUTPATIENT_CLINIC_OR_DEPARTMENT_OTHER)
Admission: EM | Admit: 2018-01-10 | Discharge: 2018-01-10 | Disposition: A | Discharge status: Home / Self Care | Payer: Self-pay | Attending: Emergency Medicine | Admitting: Emergency Medicine

## 2018-01-10 ENCOUNTER — Emergency Department (HOSPITAL_BASED_OUTPATIENT_CLINIC_OR_DEPARTMENT_OTHER): Payer: Self-pay

## 2018-01-10 DIAGNOSIS — N939 Abnormal uterine and vaginal bleeding, unspecified: Secondary | ICD-10-CM | POA: Insufficient documentation

## 2018-01-10 DIAGNOSIS — Z79899 Other long term (current) drug therapy: Secondary | ICD-10-CM | POA: Insufficient documentation

## 2018-01-10 LAB — COMPREHENSIVE METABOLIC PANEL
ALT: 14 U/L (ref 0–44)
AST: 22 U/L (ref 15–41)
Albumin: 4.6 g/dL (ref 3.5–5.0)
Alkaline Phosphatase: 37 U/L — ABNORMAL LOW (ref 38–126)
Anion gap: 8 (ref 5–15)
BUN: 14 mg/dL (ref 6–20)
CHLORIDE: 104 mmol/L (ref 98–111)
CO2: 25 mmol/L (ref 22–32)
Calcium: 9.3 mg/dL (ref 8.9–10.3)
Creatinine, Ser: 0.63 mg/dL (ref 0.44–1.00)
Glucose, Bld: 97 mg/dL (ref 70–99)
POTASSIUM: 3.8 mmol/L (ref 3.5–5.1)
Sodium: 137 mmol/L (ref 135–145)
Total Bilirubin: 0.7 mg/dL (ref 0.3–1.2)
Total Protein: 8 g/dL (ref 6.5–8.1)

## 2018-01-10 LAB — CBC WITH DIFFERENTIAL/PLATELET
ABS IMMATURE GRANULOCYTES: 0.02 10*3/uL (ref 0.00–0.07)
BASOS PCT: 0 %
Basophils Absolute: 0 10*3/uL (ref 0.0–0.1)
Eosinophils Absolute: 0 10*3/uL (ref 0.0–0.5)
Eosinophils Relative: 1 %
HCT: 37.9 % (ref 36.0–46.0)
Hemoglobin: 12.2 g/dL (ref 12.0–15.0)
IMMATURE GRANULOCYTES: 0 %
Lymphocytes Relative: 21 %
Lymphs Abs: 1.7 10*3/uL (ref 0.7–4.0)
MCH: 29.4 pg (ref 26.0–34.0)
MCHC: 32.2 g/dL (ref 30.0–36.0)
MCV: 91.3 fL (ref 80.0–100.0)
MONO ABS: 0.6 10*3/uL (ref 0.1–1.0)
Monocytes Relative: 7 %
NEUTROS ABS: 5.7 10*3/uL (ref 1.7–7.7)
NEUTROS PCT: 71 %
Platelets: 190 10*3/uL (ref 150–400)
RBC: 4.15 MIL/uL (ref 3.87–5.11)
RDW: 12.4 % (ref 11.5–15.5)
WBC: 8 10*3/uL (ref 4.0–10.5)
nRBC: 0 % (ref 0.0–0.2)

## 2018-01-10 LAB — URINALYSIS, ROUTINE W REFLEX MICROSCOPIC
Bilirubin Urine: NEGATIVE
Glucose, UA: NEGATIVE mg/dL
Ketones, ur: NEGATIVE mg/dL
Leukocytes, UA: NEGATIVE
NITRITE: NEGATIVE
Protein, ur: NEGATIVE mg/dL
SPECIFIC GRAVITY, URINE: 1.02 (ref 1.005–1.030)
pH: 6 (ref 5.0–8.0)

## 2018-01-10 LAB — WET PREP, GENITAL
CLUE CELLS WET PREP: NONE SEEN
Sperm: NONE SEEN
TRICH WET PREP: NONE SEEN
WBC, Wet Prep HPF POC: NONE SEEN
YEAST WET PREP: NONE SEEN

## 2018-01-10 LAB — URINALYSIS, MICROSCOPIC (REFLEX): BACTERIA UA: NONE SEEN

## 2018-01-10 LAB — PREGNANCY, URINE: PREG TEST UR: NEGATIVE

## 2018-01-10 LAB — LIPASE, BLOOD: LIPASE: 28 U/L (ref 11–51)

## 2018-01-10 MED ORDER — NORETHIN ACE-ETH ESTRAD-FE 1-20 MG-MCG PO TABS: ORAL_TABLET | ORAL | 0 refills | Status: DC

## 2018-01-10 MED ORDER — NAPROXEN 375 MG PO TABS: 375.0000 mg | ORAL_TABLET | Freq: Two times a day (BID) | ORAL | 0 refills | Status: DC

## 2018-01-10 NOTE — ED Triage Notes (Signed)
Pt c/o vaginal bleeding with clots x 15 days

## 2018-01-10 NOTE — ED Notes (Signed)
Pt verbalizes understanding of d/c instructions and denies any further needs at this time. 

## 2018-01-10 NOTE — ED Notes (Signed)
Pt request a female provider

## 2018-01-10 NOTE — Discharge Instructions (Signed)
You were seen in the emergency department today for vaginal bleeding and pelvic pain.  Your work-up in the ER was overall reassuring.  Your labs did not show significant abnormalities.  Your ultrasound showed that your endometrium (part of your uterus) is somewhat thick which can occur with vaginal bleeding.  Per discussion with the women's health specialist we are starting you on birth control [facility] FE 1/20).  Please take 2 tablets of this medicine daily until your bleeding has stopped, after the bleeding has stopped please take 1 pill daily until you have followed up with your OB/GYN doctor.  We are also sending you home with a prescription for naproxen to help with pain. - Naproxen is a nonsteroidal anti-inflammatory medication that will help with pain and swelling. Be sure to take this medication as prescribed with food, 1 pill every 12 hours,  It should be taken with food, as it can cause stomach upset, and more seriously, stomach bleeding. Do not take other nonsteroidal anti-inflammatory medications with this such as Advil, Motrin, Aleve, Mobic, Goodie Powder, or Motrin.    You make take Tylenol per over the counter dosing with these medications.   We have prescribed you new medication(s) today. Discuss the medications prescribed today with your pharmacist as they can have adverse effects and interactions with your other medicines including over the counter and prescribed medications. Seek medical evaluation if you start to experience new or abnormal symptoms after taking one of these medicines, seek care immediately if you start to experience difficulty breathing, feeling of your throat closing, facial swelling, or rash as these could be indications of a more serious allergic reaction  Please call the phone number circled in your discharge instructions to make an appointment with OB/GYN within 1 week.  Return to the ER for new or worsening symptoms including but not limited to worsening pain,  worsening bleeding, lightheadedness, dizziness, passing out, chest pain, trouble breathing, or any other concerns.

## 2018-01-10 NOTE — ED Notes (Signed)
ED Provider at bedside. 

## 2018-01-10 NOTE — ED Provider Notes (Signed)
[facility] EMERGENCY DEPARTMENT Provider Note   CSN: [id number] Arrival date & time: 01/10/18  1352     History   Chief Complaint Chief Complaint  Patient presents with  . Vaginal Bleeding    HPI Amy Young is a 27 y.o. female with a history of prior cesarean section as well as VBAC delivery who presents to the ED with her husband for assessment of vaginal bleeding.   Patient reports LMP 09/27. Following menstrual cycle she developed yellow vaginal discharge for about 1 week which resolved with subsequent development of vaginal bleeding 10/09. She has had fairly constant bleeding since onset which has had clots of blood. She requires 3 pad changes throughout the day. She has associated pelvic pain which is fairly constant, currently a 5/10 in severity, without specific alleviating/aggravating factors. She has hx of similar since birth of her child 1 year prior, difficulty further elaborating on this. Denies fever, chills, nausea, vomiting, diarrhea, or dysuria. Sexually active in monogamous relationship.  Denies chest pain, dyspnea, lightheadedness, dizziness, or syncope.   Offered/encouraged translator line, patient refused and would prefer her husband translate.   HPI  Past Medical History:  Diagnosis Date  . Medical history non-contributory     Patient Active Problem List   Diagnosis Date Noted  . VBAC, delivered 01/29/2017  . Normal labor 01/28/2017    Past Surgical History:  Procedure Laterality Date  . CESAREAN SECTION       OB History    Gravida  2   Para  2   Term  2   Preterm      AB      Living  2     SAB      TAB      Ectopic      Multiple  0   Live Births  1            Home Medications    Prior to Admission medications   Medication Sig Start Date End Date Taking? Authorizing Provider  ibuprofen (ADVIL,MOTRIN) 600 MG tablet Take 1 tablet (600 mg total) every 6 (six) hours by mouth. 01/29/17   Minott, Lynnae Prude, MD    Prenatal Vit-Fe Fumarate-FA (PRENATAL MULTIVITAMIN) TABS tablet Take 1 tablet daily at 12 noon by mouth.    [provider]  senna-docusate (SENOKOT-S) 8.6-50 MG tablet Take 2 tablets daily by mouth. 01/30/17   Minott, Lynnae Prude, MD    Family History History reviewed. No pertinent family history.  Social History Social History   Tobacco Use  . Smoking status: Never Smoker  . Smokeless tobacco: Never Used  Substance Use Topics  . Alcohol use: No  . Drug use: No     Allergies   Patient has no known allergies.   Review of Systems Review of Systems  Constitutional: Negative for chills and fever.  Respiratory: Negative for shortness of breath.   Cardiovascular: Negative for chest pain.  Gastrointestinal: Negative for blood in stool, diarrhea, nausea and vomiting.  Genitourinary: Positive for pelvic pain, vaginal bleeding and vaginal discharge (resolved). Negative for dysuria.  Neurological: Negative for dizziness, syncope, light-headedness and headaches.  All other systems reviewed and are negative.    Physical Exam Updated Vital Signs BP 123/82   Pulse 90   Temp 98.4 F (36.9 C) (Oral)   Resp 18   Ht 5\' 7"  (1.702 m)   Wt 52.2 kg   LMP 12/18/2017   SpO2 98%   BMI 18.01 kg/m  Physical Exam  Constitutional: She appears well-developed and well-nourished.  Non-toxic appearance. No distress.  HENT:  Head: Normocephalic and atraumatic.  Eyes: Conjunctivae are normal. Right eye exhibits no discharge. Left eye exhibits no discharge.  Neck: Neck supple.  Cardiovascular: Normal rate and regular rhythm.  Pulmonary/Chest: Effort normal and breath sounds normal. No respiratory distress. She has no wheezes. She has no rhonchi. She has no rales.  Respiration even and unlabored  Abdominal: Soft. She exhibits no distension. There is tenderness (bilateral suprapubic/pelvic area) in the suprapubic area. There is no rigidity, no rebound, no guarding, no CVA tenderness and  no tenderness at McBurney's point.  Genitourinary: Pelvic exam was performed with patient supine. There is no rash or tenderness on the right labia. There is no rash or tenderness on the left labia. There is bleeding (moderate to large amount of vagina bleeding with clots in the vaginal vault. All clots and majority of blood removed for visualization. ) in the vagina. No vaginal discharge found.  Genitourinary Comments: Patient diffusely tender throughout pelvic exam.  RN Theora Gianotti present as chaperone.   Neurological: She is alert.  Clear speech.   Skin: Skin is warm and dry. No rash noted.  Psychiatric: She has a normal mood and affect. Her behavior is normal.  Nursing note and vitals reviewed.   ED Treatments / Results  Labs Results for orders placed or performed during the hospital encounter of 01/10/18  Wet prep, genital  Result Value Ref Range   Yeast Wet Prep HPF POC NONE SEEN NONE SEEN   Trich, Wet Prep NONE SEEN NONE SEEN   Clue Cells Wet Prep HPF POC NONE SEEN NONE SEEN   WBC, Wet Prep HPF POC NONE SEEN NONE SEEN   Sperm NONE SEEN   Urinalysis, Routine w reflex microscopic  Result Value Ref Range   Color, Urine YELLOW YELLOW   APPearance CLEAR CLEAR   Specific Gravity, Urine 1.020 1.005 - 1.030   pH 6.0 5.0 - 8.0   Glucose, UA NEGATIVE NEGATIVE mg/dL   Hgb urine dipstick LARGE (A) NEGATIVE   Bilirubin Urine NEGATIVE NEGATIVE   Ketones, ur NEGATIVE NEGATIVE mg/dL   Protein, ur NEGATIVE NEGATIVE mg/dL   Nitrite NEGATIVE NEGATIVE   Leukocytes, UA NEGATIVE NEGATIVE  Pregnancy, urine  Result Value Ref Range   Preg Test, Ur NEGATIVE NEGATIVE  Urinalysis, Microscopic (reflex)  Result Value Ref Range   RBC / HPF >50 0 - 5 RBC/hpf   WBC, UA 0-5 0 - 5 WBC/hpf   Bacteria, UA NONE SEEN NONE SEEN   Squamous Epithelial / LPF 0-5 0 - 5  Comprehensive metabolic panel  Result Value Ref Range   Sodium 137 135 - 145 mmol/L   Potassium 3.8 3.5 - 5.1 mmol/L   Chloride 104 98 - 111  mmol/L   CO2 25 22 - 32 mmol/L   Glucose, Bld 97 70 - 99 mg/dL   BUN 14 6 - 20 mg/dL   Creatinine, Ser 1.61 0.44 - 1.00 mg/dL   Calcium 9.3 8.9 - 09.6 mg/dL   Total Protein 8.0 6.5 - 8.1 g/dL   Albumin 4.6 3.5 - 5.0 g/dL   AST 22 15 - 41 U/L   ALT 14 0 - 44 U/L   Alkaline Phosphatase 37 (L) 38 - 126 U/L   Total Bilirubin 0.7 0.3 - 1.2 mg/dL   GFR calc non Af Amer >60 >60 mL/min   GFR calc Af Amer >60 >60 mL/min   Anion gap  8 5 - 15  Lipase, blood  Result Value Ref Range   Lipase 28 11 - 51 U/L  CBC with Differential  Result Value Ref Range   WBC 8.0 4.0 - 10.5 K/uL   RBC 4.15 3.87 - 5.11 MIL/uL   Hemoglobin 12.2 12.0 - 15.0 g/dL   HCT 16.1 09.6 - 04.5 %   MCV 91.3 80.0 - 100.0 fL   MCH 29.4 26.0 - 34.0 pg   MCHC 32.2 30.0 - 36.0 g/dL   RDW 40.9 81.1 - 91.4 %   Platelets 190 150 - 400 K/uL   nRBC 0.0 0.0 - 0.2 %   Neutrophils Relative % 71 %   Neutro Abs 5.7 1.7 - 7.7 K/uL   Lymphocytes Relative 21 %   Lymphs Abs 1.7 0.7 - 4.0 K/uL   Monocytes Relative 7 %   Monocytes Absolute 0.6 0.1 - 1.0 K/uL   Eosinophils Relative 1 %   Eosinophils Absolute 0.0 0.0 - 0.5 K/uL   Basophils Relative 0 %   Basophils Absolute 0.0 0.0 - 0.1 K/uL   Immature Granulocytes 0 %   Abs Immature Granulocytes 0.02 0.00 - 0.07 K/uL     EKG None  Radiology US Transvaginal Non-ob  Result Date: 01/10/2018 CLINICAL DATA:  Vaginal bleeding 2 weeks with clots. LMP 12/18/2017. EXAM: TRANSABDOMINAL AND TRANSVAGINAL ULTRASOUND OF PELVIS DOPPLER ULTRASOUND OF OVARIES TECHNIQUE: Both transabdominal and transvaginal ultrasound examinations of the pelvis were performed. Transabdominal technique was performed for global imaging of the pelvis including uterus, ovaries, adnexal regions, and pelvic cul-de-sac. It was necessary to proceed with endovaginal exam following the transabdominal exam to visualize the endometrium and ovaries. Color and duplex Doppler ultrasound was utilized to evaluate blood flow to the  ovaries. COMPARISON:  CT 12/07/2017 FINDINGS: Uterus Measurements: 4.4 x 6.2 x 7.6 cm. Retroverted. Increased vascularity. No fibroids or other mass visualized. Endometrium Thickness: 22 mm.  No focal abnormality visualized. Right ovary Measurements: 2.3 x 2.9 x 3.0 cm. Normal appearance/no adnexal mass. Left ovary Measurements: 2.3 x 1.9 x 3.2 cm. Normal appearance/no adnexal mass. Pulsed Doppler evaluation of both ovaries demonstrates normal low-resistance arterial and venous waveforms. Other findings Mild-to-moderate free fluid. IMPRESSION: Retroverted uterus demonstrating mild endometrial thickening measuring 22 mm without focal abnormality. Findings may be due to hyperplasia or polyp and less likely submucosal fibroid or neoplasm. Recommend GYN consultation. Normal ovaries.  No evidence of ovarian torsion. Electronically Signed   By: Elberta Fortis M.D.   On: 01/10/2018 17:25   US Pelvis Complete  Result Date: 01/10/2018 CLINICAL DATA:  Vaginal bleeding 2 weeks with clots. LMP 12/18/2017. EXAM: TRANSABDOMINAL AND TRANSVAGINAL ULTRASOUND OF PELVIS DOPPLER ULTRASOUND OF OVARIES TECHNIQUE: Both transabdominal and transvaginal ultrasound examinations of the pelvis were performed. Transabdominal technique was performed for global imaging of the pelvis including uterus, ovaries, adnexal regions, and pelvic cul-de-sac. It was necessary to proceed with endovaginal exam following the transabdominal exam to visualize the endometrium and ovaries. Color and duplex Doppler ultrasound was utilized to evaluate blood flow to the ovaries. COMPARISON:  CT 12/07/2017 FINDINGS: Uterus Measurements: 4.4 x 6.2 x 7.6 cm. Retroverted. Increased vascularity. No fibroids or other mass visualized. Endometrium Thickness: 22 mm.  No focal abnormality visualized. Right ovary Measurements: 2.3 x 2.9 x 3.0 cm. Normal appearance/no adnexal mass. Left ovary Measurements: 2.3 x 1.9 x 3.2 cm. Normal appearance/no adnexal mass. Pulsed Doppler  evaluation of both ovaries demonstrates normal low-resistance arterial and venous waveforms. Other findings Mild-to-moderate free fluid. IMPRESSION: Retroverted  uterus demonstrating mild endometrial thickening measuring 22 mm without focal abnormality. Findings may be due to hyperplasia or polyp and less likely submucosal fibroid or neoplasm. Recommend GYN consultation. Normal ovaries.  No evidence of ovarian torsion. Electronically Signed   By: Elberta Fortis M.D.   On: 01/10/2018 17:25   [place] Art/ven Flow Abd Pelv Doppler  Result Date: 01/10/2018 CLINICAL DATA:  Vaginal bleeding 2 weeks with clots. LMP 12/18/2017. EXAM: TRANSABDOMINAL AND TRANSVAGINAL ULTRASOUND OF PELVIS DOPPLER ULTRASOUND OF OVARIES TECHNIQUE: Both transabdominal and transvaginal ultrasound examinations of the pelvis were performed. Transabdominal technique was performed for global imaging of the pelvis including uterus, ovaries, adnexal regions, and pelvic cul-de-sac. It was necessary to proceed with endovaginal exam following the transabdominal exam to visualize the endometrium and ovaries. Color and duplex Doppler ultrasound was utilized to evaluate blood flow to the ovaries. COMPARISON:  CT 12/07/2017 FINDINGS: Uterus Measurements: 4.4 x 6.2 x 7.6 cm. Retroverted. Increased vascularity. No fibroids or other mass visualized. Endometrium Thickness: 22 mm.  No focal abnormality visualized. Right ovary Measurements: 2.3 x 2.9 x 3.0 cm. Normal appearance/no adnexal mass. Left ovary Measurements: 2.3 x 1.9 x 3.2 cm. Normal appearance/no adnexal mass. Pulsed Doppler evaluation of both ovaries demonstrates normal low-resistance arterial and venous waveforms. Other findings Mild-to-moderate free fluid. IMPRESSION: Retroverted uterus demonstrating mild endometrial thickening measuring 22 mm without focal abnormality. Findings may be due to hyperplasia or polyp and less likely submucosal fibroid or neoplasm. Recommend GYN consultation. Normal  ovaries.  No evidence of ovarian torsion. Electronically Signed   By: Elberta Fortis M.D.   On: 01/10/2018 17:25    Procedures Procedures (including critical care time)  Medications Ordered in ED Medications - No data to display   Initial Impression / Assessment and Plan / ED Course  I have reviewed the triage vital signs and the nursing notes.  Pertinent labs & imaging results that were available during my care of the patient were reviewed by me and considered in my medical decision making (see chart for details).   Patient presents to the emergency department with her husband for vaginal bleeding and pelvic pain for the past 15 days.  Patient nontoxic-appearing, no apparent distress, vitals WNL.  Exam is notable for diffuse pelvic tenderness both on abdominal exam as well as with bimanual.  She has a moderate to large amount of vaginal bleeding on speculum exam.  Will further evaluate with labs as well as ultrasound.  Pelvic/pubic suprapubic area abdominal exam is nontender, additionally she is without peritoneal signs, obstruction/perforation, pancreatitis, diverticulitis, cholecystitis, or appendicitis. Urinalysis with findings consistent with vaginal bleeding, no indication of infection to raise concern for UTI or pyelonephritis.  Her pregnancy test is negative therefore I doubt ectopic pregnancy or spontaneous abortion.  Her lab work is overall reassuring.  No significant elect light disturbance.  Renal function, LFTs, and lipase are within normal limits.  CBC without leukocytosis or anemia.Ultrasound with  retroverted uterus demonstrating mild endometrial thickening measuring 22 mm without focal abnormality. Findings may be due to hyperplasia or polyp and less likely submucosal fibroid or neoplasm. Recommend GYN consultation. Normal ovaries.  No evidence of ovarian torsion.  CONSULT: Discussed case with obgyn physician Dr. Debroah Loop- recommends starting patient on OCP- recommends monophasic, no  specific brand or dose ratio, and having patient take 2 tablets daily until bleeding has resolved then switching to 1 tablet daily with clinic follow up.   Patient's hgb/hct WNL, no hypotension/tachycardia, appears hemodynamically stable at this time. Plan as  above. Also given prescription for naproxen for pain. Discussed specific side effects with these medicines. I discussed results, treatment plan, need for follow-up, and return precautions with the patient and her husband. Provided opportunity for questions, patient and her husband confirmed understanding and are in agreement with plan.    Final Clinical Impressions(s) / ED Diagnoses   Final diagnoses:  Vaginal bleeding    ED Discharge Orders         Ordered    norethindrone-ethinyl estradiol (BLISOVI FE 1/20) 1-20 MG-MCG tablet     01/10/18 1821    naproxen (NAPROSYN) 375 MG tablet  2 times daily     01/10/18 1821           Drinda Belgard, Pleas Koch, PA-C 01/10/18 Natividad Brood, MD 01/11/18 [phone]

## 2018-01-11 LAB — RPR: RPR: NONREACTIVE

## 2018-01-11 LAB — HIV ANTIBODY (ROUTINE TESTING W REFLEX): HIV SCREEN 4TH GENERATION: NONREACTIVE

## 2018-01-11 LAB — GC/CHLAMYDIA PROBE AMP (~~LOC~~) NOT AT ARMC
CHLAMYDIA, DNA PROBE: POSITIVE — AB
Neisseria Gonorrhea: NEGATIVE

## 2018-01-12 ENCOUNTER — Encounter (HOSPITAL_COMMUNITY): Payer: Self-pay | Admitting: *Deleted

## 2018-01-12 ENCOUNTER — Inpatient Hospital Stay (HOSPITAL_COMMUNITY)
Admission: AD | Admit: 2018-01-12 | Discharge: 2018-01-12 | Disposition: A | Discharge status: Home / Self Care | Payer: Self-pay | Source: Ambulatory Visit | Attending: Obstetrics and Gynecology | Admitting: Obstetrics and Gynecology

## 2018-01-12 DIAGNOSIS — D509 Iron deficiency anemia, unspecified: Secondary | ICD-10-CM | POA: Insufficient documentation

## 2018-01-12 DIAGNOSIS — N939 Abnormal uterine and vaginal bleeding, unspecified: Secondary | ICD-10-CM | POA: Insufficient documentation

## 2018-01-12 DIAGNOSIS — A749 Chlamydial infection, unspecified: Secondary | ICD-10-CM | POA: Diagnosis present

## 2018-01-12 DIAGNOSIS — A568 Sexually transmitted chlamydial infection of other sites: Secondary | ICD-10-CM | POA: Insufficient documentation

## 2018-01-12 LAB — CBC
HEMATOCRIT: 32 % — AB (ref 36.0–46.0)
HEMOGLOBIN: 10.9 g/dL — AB (ref 12.0–15.0)
MCH: 30.6 pg (ref 26.0–34.0)
MCHC: 34.1 g/dL (ref 30.0–36.0)
MCV: 89.9 fL (ref 80.0–100.0)
Platelets: 173 10*3/uL (ref 150–400)
RBC: 3.56 MIL/uL — ABNORMAL LOW (ref 3.87–5.11)
RDW: 12.5 % (ref 11.5–15.5)
WBC: 6.8 10*3/uL (ref 4.0–10.5)
nRBC: 0 % (ref 0.0–0.2)

## 2018-01-12 MED ORDER — MEGESTROL ACETATE 40 MG PO TABS: 80.0000 mg | ORAL_TABLET | Freq: Two times a day (BID) | ORAL | 0 refills | Status: DC

## 2018-01-12 MED ORDER — FERROUS SULFATE 325 (65 FE) MG PO TABS: 325.0000 mg | ORAL_TABLET | Freq: Two times a day (BID) | ORAL | 6 refills | Status: DC

## 2018-01-12 MED ORDER — AZITHROMYCIN 250 MG PO TABS
1000.0000 mg | ORAL_TABLET | Freq: Once | ORAL | Status: AC
  Administered 2018-01-12: 1000 mg via ORAL
  Filled 2018-01-12: qty 4

## 2018-01-12 NOTE — MAU Note (Addendum)
Presents with c/o VB that began 17 days ago.  Pt reports seen in ED for vaginal discharge and began bleeding 1 week later.  Reports vaginal discharge initially had an odor, but not currently.  Pt reports given meds for treatment for discharge.

## 2018-01-12 NOTE — MAU Provider Note (Signed)
History     CSN: [id number]  Arrival date and time: 01/12/18 1616   First Provider Initiated Contact with Patient 01/12/18 1718 - assessment and explanation of STI transmission and treatment by [employer] video interpreter Varinder [phone]    Chief Complaint  Patient presents with  . Vaginal Bleeding   HPI  Ms.  Amy Young is a 27 y.o. year old [id number] female who presents to MAU reporting VB that began 17 days ago. She was seen in ED on 01/10/2018 for vaginal discharge and began bleeding 1 week later. She reports vaginal discharge initially had an odor, but not currently. She reports given meds for treatment for discharge. She is concerned that the bleeding continues to return intermittently after SI lasting for 3-4 days. She received a call from Antonieta Iba., RN this afternoon informing her of (+) CT. Her spouse wants to know why she has a STI and he doesn't have any symptoms. She is requesting to have her VB evaluated.  Past Medical History:  Diagnosis Date  . Medical history non-contributory     Past Surgical History:  Procedure Laterality Date  . CESAREAN SECTION      Family History  Problem Relation Age of Onset  . Hypertension Mother     Social History   Tobacco Use  . Smoking status: Never Smoker  . Smokeless tobacco: Never Used  Substance Use Topics  . Alcohol use: No  . Drug use: No    Allergies: No Known Allergies  Medications Prior to Admission  Medication Sig Dispense Refill Last Dose  . ibuprofen (ADVIL,MOTRIN) 600 MG tablet Take 1 tablet (600 mg total) every 6 (six) hours by mouth. 30 tablet 0   . naproxen (NAPROSYN) 375 MG tablet Take 1 tablet (375 mg total) by mouth 2 (two) times daily. 20 tablet 0   . norethindrone-ethinyl estradiol (BLISOVI FE 1/20) 1-20 MG-MCG tablet Take 2 tablets daily until vaginal bleeding stops, after this take 1 tablet daily until you have discussed with OBGYN 1 Package 0   . Prenatal Vit-Fe Fumarate-FA (PRENATAL MULTIVITAMIN)  TABS tablet Take 1 tablet daily at 12 noon by mouth.   01/28/2017 at Unknown time  . senna-docusate (SENOKOT-S) 8.6-50 MG tablet Take 2 tablets daily by mouth. 60 tablet 0     Review of Systems  Constitutional: Negative.   HENT: Negative.   Eyes: Negative.   Respiratory: Negative.   Cardiovascular: Negative.   Gastrointestinal: Negative.   Endocrine: Negative.   Genitourinary: Positive for pelvic pain, vaginal bleeding and vaginal discharge.  Musculoskeletal: Negative.   Skin: Negative.   Allergic/Immunologic: Negative.   Neurological: Negative.   Hematological: Negative.   Psychiatric/Behavioral: Negative.    Physical Exam   Blood pressure (!) 121/58, pulse 90, temperature 98.1 F (36.7 C), temperature source Oral, resp. rate 18, last menstrual period 12/10/2017, SpO2 100 %, currently breastfeeding.  Physical Exam  Nursing note and vitals reviewed. Constitutional: She is oriented to person, place, and time. She appears well-developed and well-nourished.  HENT:  Head: Normocephalic and atraumatic.  Eyes: Pupils are equal, round, and reactive to light.  Neck: Normal range of motion.  Cardiovascular: Normal rate.  Respiratory: Effort normal.  GI: Soft.  Genitourinary:  Genitourinary Comments: SE: cervix is smooth, pink, no lesions, moderate amt of dark, red blood, cervix closed/long/firm, no CMT or friability, no adnexal tenderness   Musculoskeletal: Normal range of motion.  Neurological: She is alert and oriented to person, place, and time.  Skin: Skin is warm and  dry.  Psychiatric: She has a normal mood and affect. Her behavior is normal. Judgment and thought content normal.    MAU Course  Procedures  MDM CBC --> HgB from 10/23 = 12.2 Speculum Exam Zithromax 1000 mg po Expedited Partner Tx  Results for orders placed or performed during the hospital encounter of 01/12/18 (from the past 24 hour(s))  CBC     Status: Abnormal   Collection Time: 01/12/18  6:04 PM   Result Value Ref Range   WBC 6.8 4.0 - 10.5 K/uL   RBC 3.56 (L) 3.87 - 5.11 MIL/uL   Hemoglobin 10.9 (L) 12.0 - 15.0 g/dL   HCT 96.0 (L) 45.4 - 09.8 %   MCV 89.9 80.0 - 100.0 fL   MCH 30.6 26.0 - 34.0 pg   MCHC 34.1 30.0 - 36.0 g/dL   RDW 11.9 14.7 - 82.9 %   Platelets 173 150 - 400 K/uL   nRBC 0.0 0.0 - 0.2 %    Assessment and Plan  Chlamydia - Information provided on CT, STD, Safe Sex, Preventing STIs   Iron deficiency anemia, unspecified iron deficiency anemia type - Rx for FeSO4 BID with meals  Abnormal uterine bleeding (AUB) - Stop BCP - Change Rx to Megace 80 mg BID until seen by Dr. Marice Potter on 01/25/18 - Information provided on AUB   - Discharge patient - Keep scheduled appt on 01/25/18 with Dr. Marice Potter - Patient verbalized an understanding of the plan of care and agrees.     Raelyn Mora, MSN, CNM 01/12/2018, 5:21 PM

## 2018-01-12 NOTE — MAU Note (Signed)
Urine sent to lab 

## 2018-01-12 NOTE — Discharge Instructions (Signed)
Expedited Partner Therapy:  °Information Sheet for Patients and Partners  °            ° °You have been offered expedited partner therapy (EPT). This information sheet contains important information and warnings you need to be aware of, so please read it carefully.  ° °Expedited Partner Therapy (EPT) is the clinical practice of treating the sexual partners of persons who receive chlamydia, gonorrhea, or trichomoniasis diagnoses by providing medications or prescriptions to the patient. Patients then provide partners with these therapies without the health-care provider having examined the partner. In other words, EPT is a convenient, fast and private way for patients to help their sexual partners get treated.  ° °Chlamydia and gonorrhea are bacterial infections you get from having sex with a person who is already infected. Trichomoniasis (or “trich”) is a very common sexually transmitted infection (STI) that is caused by infection with a protozoan parasite called Trichomonas vaginalis.  Many people with these infections don’t know it because they feel fine, but without treatment these infections can cause serious health problems, such as pelvic inflammatory disease, ectopic pregnancy, infertility and increased risk of HIV.  ° °It is important to get treated as soon as possible to protect your health, to avoid spreading these infections to others, and to prevent yourself from becoming re-infected. The good news is these infections can be easily cured with proper antibiotic medicine. The best way to take care of your self is to see a doctor or go to your local health department. If you are not able to see a doctor or other medical provider, you should take EPT.  ° ° °Recommended Medication: °EPT for Chlamydia:  Azithromycin (Zithromax) 1 gram orally in a single dose °EPT for Gonorrhea:  Cefixime (Suprax) 400 milligrams orally in a single dose PLUS azithromycin (Zithromax) 1 gram orally in a single dose °EPT for  Trichomoniasis:  Metronidazole (Flagyl) 2 grams orally in a single dose ° ° °These medicines are very safe. However, you should not take them if you have ever had an allergic reaction (like a rash) to any of these medicines: azithromycin (Zithromax), erythromycin, clarithromycin (Biaxin), metronidazole (Flagyl), tinidazole (Tindimax). If you are uncertain about whether you have an allergy, call your medical provider or pharmacist before taking this medicine. If you have a serious, long-term illness like kidney, liver or heart disease, colitis or stomach problems, or you are currently taking other prescription medication, talk to your provider before taking this medication.  ° °Women: If you have lower belly pain, pain during sex, vomiting, or a fever, do not take this medicine. Instead, you should see a medical provider to be certain you do not have pelvic inflammatory disease (PID). PID can be serious and lead to infertility, pregnancy problems or chronic pelvic pain.  ° °Pregnant Women: It is very important for you to see a doctor to get pregnancy services and pre-natal care. These antibiotics for EPT are safe for pregnant women, but you still need to see a medical provider as soon as possible. It is also important to note that Doxycycline is an alternative therapy for chlamydia, but it should not be taken by someone who is pregnant.  ° °Men: If you have pain or swelling in the testicles or a fever, do not take this medicine and see a medical provider.    ° °Men who have sex with men (MSM): MSM in [place] continue to experience high rates of syphilis and HIV. Many MSM with gonorrhea or   chlamydia could also have syphilis and/or HIV and not know it. If you are a man who has sex with other men, it is very important that you see a medical provider and are tested for HIV and syphilis. EPT is not recommended for gonorrhea for MSM.  Recommended treatment for gonorrhea for MSM is Rocephin (shot) AND azithromycin  due to decreased cure rate.  Please see your medical provider if this is the case.   ° °Along with this information sheet is a prescription for the medicine. If you receive a prescription it will be in your name and will indicate your date of birth, or it will be in the name of “Expedited Partner Therapy”.   In either case, you can have the prescription filled at a pharmacy. You will be responsible for the cost of the medicine, unless you have prescription drug coverage. In that case, you could provide your name so the pharmacy could bill your health plan.  ° °Take the medication as directed. Some people will have a mild, upset stomach, which does not last long. AVOID alcohol 24 hours after taking metronidazole (Flagyl) to reduce the possibility of a disulfiram-like reaction (severe vomiting and abdominal pain).  After taking the medicine, do not have sex for 7 days. Do not share this medicine or give it to anyone else. It is important to tell everyone you have had sex with in the last 60 days that they need to go and get tested for sexually transmitted infections.  ° °Ways to prevent these and other sexually transmitted infections (STIs):  ° °• Abstain from sex. This is the only sure way to avoid getting an STI.  °• Use barrier methods, such as condoms, consistently and correctly.  °• Limit the number of sexual partners.  °• Have regular physical exams, including testing for STIs.  ° °For more information about EPT or other issues pertaining to an STI, please contact your medical provider or the [place] [facility] at [phone] or [email or web address]   ° °

## 2018-01-14 ENCOUNTER — Inpatient Hospital Stay (HOSPITAL_COMMUNITY)
Admission: AD | Admit: 2018-01-14 | Discharge: 2018-01-14 | Disposition: A | Discharge status: Home / Self Care | Payer: Self-pay | Source: Ambulatory Visit | Attending: Obstetrics & Gynecology | Admitting: Obstetrics & Gynecology

## 2018-01-14 ENCOUNTER — Encounter (HOSPITAL_COMMUNITY): Payer: Self-pay | Admitting: *Deleted

## 2018-01-14 DIAGNOSIS — R102 Pelvic and perineal pain: Secondary | ICD-10-CM | POA: Insufficient documentation

## 2018-01-14 DIAGNOSIS — Z79899 Other long term (current) drug therapy: Secondary | ICD-10-CM | POA: Insufficient documentation

## 2018-01-14 DIAGNOSIS — A749 Chlamydial infection, unspecified: Secondary | ICD-10-CM | POA: Insufficient documentation

## 2018-01-14 DIAGNOSIS — Z8249 Family history of ischemic heart disease and other diseases of the circulatory system: Secondary | ICD-10-CM | POA: Insufficient documentation

## 2018-01-14 DIAGNOSIS — D500 Iron deficiency anemia secondary to blood loss (chronic): Secondary | ICD-10-CM | POA: Insufficient documentation

## 2018-01-14 LAB — CBC
HCT: 31 % — ABNORMAL LOW (ref 36.0–46.0)
Hemoglobin: 10.4 g/dL — ABNORMAL LOW (ref 12.0–15.0)
MCH: 30.1 pg (ref 26.0–34.0)
MCHC: 33.5 g/dL (ref 30.0–36.0)
MCV: 89.9 fL (ref 80.0–100.0)
NRBC: 0 % (ref 0.0–0.2)
PLATELETS: 167 10*3/uL (ref 150–400)
RBC: 3.45 MIL/uL — ABNORMAL LOW (ref 3.87–5.11)
RDW: 12.7 % (ref 11.5–15.5)
WBC: 5.8 10*3/uL (ref 4.0–10.5)

## 2018-01-14 LAB — URINALYSIS, ROUTINE W REFLEX MICROSCOPIC
BILIRUBIN URINE: NEGATIVE
Glucose, UA: NEGATIVE mg/dL
KETONES UR: NEGATIVE mg/dL
NITRITE: NEGATIVE
PH: 6 (ref 5.0–8.0)
PROTEIN: 100 mg/dL — AB
Specific Gravity, Urine: 1.003 — ABNORMAL LOW (ref 1.005–1.030)

## 2018-01-14 LAB — POCT PREGNANCY, URINE: PREG TEST UR: NEGATIVE

## 2018-01-14 MED ORDER — KETOROLAC TROMETHAMINE 30 MG/ML IJ SOLN
30.0000 mg | Freq: Once | INTRAMUSCULAR | Status: AC
  Administered 2018-01-14: 30 mg via INTRAMUSCULAR
  Filled 2018-01-14: qty 1

## 2018-01-14 NOTE — MAU Note (Addendum)
Pt C/O pelvic pain & increased bleeding.  Was here a few days ago, had pos chlamydia, received treatment on Friday.  Today is C/O chills, feeling like she is going to pass out, her feet are very cold.  This has been going on since Friday.  Is still bleeding today, feels like she is going to faint when she stands up.  Has changed two pads today.  Pt's husband upset that [employer] interpreter used for triage assessment.  Explained to him it is required that we use hospital approved interpreter.  Pt somewhat reluctant to answer questions, at one point husband took phone from pt & spoke to the interpreter himself.  Explained to pt that we need to hear her complaints directly from her, verbalizes understanding.

## 2018-01-14 NOTE — Discharge Instructions (Signed)

## 2018-01-14 NOTE — Progress Notes (Signed)
1538: 30mg  Toradol given per order  1545: Pt stated she started having chills and her toes were cold.  Took temp, it is 98.3. Lab in the room to draw CBC

## 2018-01-14 NOTE — MAU Provider Note (Signed)
History     CSN: [id number]  Arrival date and time: 01/14/18 1327   First Provider Initiated Contact with Patient 01/14/18 1512      Chief Complaint  Patient presents with  . Abdominal Pain  . Vaginal Bleeding   27 y.o. non-pregnant female here for VB and pelvic pain. This is her 3rd ED visit for these sx. Sx have been ongoing x3 weeks. Describes the pain as bilateral in her pelvis. Cannot describe consistency. Rates 8/10. Has not taken analgesics. VB has been ongoing and reports saturating 3 pads today and passing stringy clots. She reports this is improving. She was seen in MAU 2 days ago and treated for Chlamydia and given Megace for the bleeding. She is taking the Megace and Fe. Since taking the meds she's developed chills, lightheadedness, and cold feet. Denies fever. Denies urinary sx. Last IC was last week.    OB History    Gravida  2   Para  2   Term  2   Preterm      AB      Living  2     SAB      TAB      Ectopic      Multiple  0   Live Births  1           Past Medical History:  Diagnosis Date  . Medical history non-contributory     Past Surgical History:  Procedure Laterality Date  . CESAREAN SECTION      Family History  Problem Relation Age of Onset  . Hypertension Mother     Social History   Tobacco Use  . Smoking status: Never Smoker  . Smokeless tobacco: Never Used  Substance Use Topics  . Alcohol use: No  . Drug use: No    Allergies: No Known Allergies  Medications Prior to Admission  Medication Sig Dispense Refill Last Dose  . ferrous sulfate 325 (65 FE) MG tablet Take 1 tablet (325 mg total) by mouth 2 (two) times daily with a meal. 60 tablet 6   . ibuprofen (ADVIL,MOTRIN) 600 MG tablet Take 1 tablet (600 mg total) every 6 (six) hours by mouth. 30 tablet 0   . megestrol (MEGACE) 40 MG tablet Take 2 tablets (80 mg total) by mouth 2 (two) times daily. 60 tablet 0   . naproxen (NAPROSYN) 375 MG tablet Take 1 tablet (375 mg  total) by mouth 2 (two) times daily. 20 tablet 0   . senna-docusate (SENOKOT-S) 8.6-50 MG tablet Take 2 tablets daily by mouth. 60 tablet 0     Review of Systems  Constitutional: Positive for chills. Negative for fever.  Gastrointestinal: Negative for abdominal pain.  Genitourinary: Positive for pelvic pain and vaginal bleeding. Negative for dysuria and frequency.  Musculoskeletal: Positive for back pain.  Neurological: Positive for light-headedness. Negative for syncope.   Physical Exam   Blood pressure 113/70, pulse 92, temperature 98.3 F (36.8 C), resp. rate 16, weight 55.8 kg, last menstrual period 12/10/2017, currently breastfeeding.  Physical Exam  Constitutional: She is oriented to person, place, and time. She appears well-developed and well-nourished. No distress.  HENT:  Head: Normocephalic and atraumatic.  Neck: Normal range of motion.  Cardiovascular: Normal rate.  Respiratory: Effort normal. No respiratory distress.  GI: Soft. She exhibits no distension and no mass. There is no tenderness. There is no rebound and no guarding.  Genitourinary:  Genitourinary Comments: External: no lesions or erythema Vagina: rugated, pink, moist, mod  bloody discharge, cleared with 3 fox swabs Uterus: non enlarged, anteverted, non tender, no CMT Adnexae: no masses, no tenderness left, no tenderness right Cervix normal   Musculoskeletal: Normal range of motion.  Neurological: She is alert and oriented to person, place, and time.  Skin: Skin is warm and dry.  Psychiatric: She has a normal mood and affect.   Results for orders placed or performed during the hospital encounter of 01/14/18 (from the past 24 hour(s))  Urinalysis, Routine w reflex microscopic     Status: Abnormal   Collection Time: 01/14/18  2:27 PM  Result Value Ref Range   Color, Urine RED (A) YELLOW   APPearance CLEAR CLEAR   Specific Gravity, Urine 1.003 (L) 1.005 - 1.030   pH 6.0 5.0 - 8.0   Glucose, UA NEGATIVE  NEGATIVE mg/dL   Hgb urine dipstick LARGE (A) NEGATIVE   Bilirubin Urine NEGATIVE NEGATIVE   Ketones, ur NEGATIVE NEGATIVE mg/dL   Protein, ur 161 (A) NEGATIVE mg/dL   Nitrite NEGATIVE NEGATIVE   Leukocytes, UA SMALL (A) NEGATIVE   RBC / HPF 0-5 0 - 5 RBC/hpf   WBC, UA 11-20 0 - 5 WBC/hpf   Bacteria, UA RARE (A) NONE SEEN   Squamous Epithelial / LPF 0-5 0 - 5  Pregnancy, urine POC     Status: None   Collection Time: 01/14/18  2:31 PM  Result Value Ref Range   Preg Test, Ur NEGATIVE NEGATIVE  CBC     Status: Abnormal   Collection Time: 01/14/18  3:45 PM  Result Value Ref Range   WBC 5.8 4.0 - 10.5 K/uL   RBC 3.45 (L) 3.87 - 5.11 MIL/uL   Hemoglobin 10.4 (L) 12.0 - 15.0 g/dL   HCT 09.6 (L) 04.5 - 40.9 %   MCV 89.9 80.0 - 100.0 fL   MCH 30.1 26.0 - 34.0 pg   MCHC 33.5 30.0 - 36.0 g/dL   RDW 81.1 91.4 - 78.2 %   Platelets 167 150 - 400 K/uL   nRBC 0.0 0.0 - 0.2 %   MAU Course  Procedures Toradol  MDM Labs ordered and reviewed. Not orthostatic. Pain improved after Toradol. Bleeding stable. Hgb stable. Discussed with pt via spouse that some pain may be expected caused by the recent Chlamydia infection. Recommend regular use of Naproxen over the next few days while the infection clears. Spouse states he was also treated. Other sx are likely d/t anemia. Continue Megace and Fe, add Fe rich foods. Stable for discharge home.   Assessment and Plan   1. Pelvic pain   2. Blood loss anemia   3. Chlamydia    Discharge home Follow up with Dr. Marice Potter as scheduled Continue Megace Continue Fe Restart Naproxen  Allergies as of 01/14/2018   No Known Allergies     Medication List    STOP taking these medications   ibuprofen 600 MG tablet Commonly known as:  ADVIL,MOTRIN     TAKE these medications   ferrous sulfate 325 (65 FE) MG tablet Take 1 tablet (325 mg total) by mouth 2 (two) times daily with a meal.   megestrol 40 MG tablet Commonly known as:  MEGACE Take 2 tablets (80  mg total) by mouth 2 (two) times daily.   naproxen 375 MG tablet Commonly known as:  NAPROSYN Take 1 tablet (375 mg total) by mouth 2 (two) times daily.   senna-docusate 8.6-50 MG tablet Commonly known as:  Senokot-S Take 2 tablets daily by mouth.  Husband interpreted for pt- consent was signed  Donette Larry, CNM 01/14/2018, 4:53 PM

## 2018-01-25 ENCOUNTER — Ambulatory Visit: Payer: Self-pay | Admitting: Internal Medicine

## 2018-01-25 ENCOUNTER — Encounter: Payer: Self-pay | Admitting: Obstetrics & Gynecology

## 2018-01-25 VITALS — BP 115/70 | HR 84 | Wt 124.4 lb

## 2018-01-25 DIAGNOSIS — Z113 Encounter for screening for infections with a predominantly sexual mode of transmission: Secondary | ICD-10-CM

## 2018-01-25 DIAGNOSIS — R300 Dysuria: Secondary | ICD-10-CM

## 2018-01-25 DIAGNOSIS — N939 Abnormal uterine and vaginal bleeding, unspecified: Secondary | ICD-10-CM

## 2018-01-25 DIAGNOSIS — D500 Iron deficiency anemia secondary to blood loss (chronic): Secondary | ICD-10-CM

## 2018-01-25 LAB — POCT URINALYSIS DIP (DEVICE)
Bilirubin Urine: NEGATIVE
GLUCOSE, UA: NEGATIVE mg/dL
Ketones, ur: NEGATIVE mg/dL
LEUKOCYTES UA: NEGATIVE
Nitrite: NEGATIVE
PROTEIN: NEGATIVE mg/dL
Specific Gravity, Urine: 1.015 (ref 1.005–1.030)
UROBILINOGEN UA: 0.2 mg/dL (ref 0.0–1.0)
pH: 5.5 (ref 5.0–8.0)

## 2018-01-25 MED ORDER — NORETHINDRONE ACETATE 5 MG PO TABS: 10.0000 mg | ORAL_TABLET | Freq: Every day | ORAL | 0 refills | Status: DC

## 2018-01-25 NOTE — Progress Notes (Signed)
   Subjective:    Amy Young - 27 y.o. female MRN [id number]  Date of birth: [birth year]  HPI  Amy Young is a 27 y.o. G54P2002 female here for AUB. Patient signed consent for husband to interpret. Reports about 4 weeks of heavy persistent uterine bleeding. Was originally soaking 7-8 pads per day. Now using about 3 pads per day. Currently taking Megace and NSAIDs. Want to change medical management of the AUB. Still interested in having children so do not want any long acting contraceptive methods to manage bleeding. Of note, was recently diagnosed and treated for Chlamydia. Recent pelvic exam in MAU unremarkable. Also endorsing dysuria and suprapubic pain with urination.     OB History    Gravida  2   Para  2   Term  2   Preterm      AB      Living  2     SAB      TAB      Ectopic      Multiple  0   Live Births  1            -  reports that she has never smoked. She has never used smokeless tobacco. - Review of Systems: Per HPI. - Past Medical History: Patient Active Problem List   Diagnosis Date Noted  . Chlamydia 01/12/2018   - Medications: reviewed and updated   Objective:   Physical Exam BP 115/70   Pulse 84   Wt 56.4 kg   LMP 12/10/2017 (LMP Unknown)   BMI 19.48 kg/m  Gen: NAD, alert, cooperative with exam, well-appearing GU/GYN: Declines pelvic exam.        Assessment & Plan:   1. Blood loss anemia Most recent HgB 10.4 on 10/27. Will repeat CBC and encouraged continued use of Fe supplement.  - CBC  2. Abnormal uterine bleeding Patient requests change in therapy. Will change from Megace to Progestin challenge over 10 day course. Discussed with patient that this should cause withdrawal bleed. Interested in having more children so LARC or DepoProvera not preferred options. Follow up in 2 weeks with GYN consult.  - US PELVIC COMPLETE WITH TRANSVAGINAL; Future - norethindrone (AYGESTIN) 5 MG tablet; Take 2 tablets (10 mg total) by mouth daily.   Dispense: 10 tablet; Refill: 0  3. Dysuria UA only remarkable for large HgB likely related to AUB. Otherwise negative for infectious process. Will obtain repeat GC/Chlamydia as this could cause dysuria and patient with recent +infection.  - GC/Chlamydia probe amp ([place])not at [facility]   Routine preventative health maintenance measures emphasized. Please refer to After Visit Summary for other counseling recommendations.   Return in about 2 weeks (around 02/08/2018) for AUB .  Marcy Siren, D.O. OB Fellow  01/28/2018, 9:42 PM

## 2018-01-25 NOTE — Patient Instructions (Addendum)
Stop taking Megace. Start taking the Norethindrone 10 mg for 10 days. This should stop your bleeding within a few days. Once you stop taking the pill you should have a withdrawal bleed like a period and it should reset your bleeding.   I have ordered an ultrasound to see if there is a functional/anatomical cause of your bleeding.

## 2018-01-26 LAB — CBC
HEMATOCRIT: 32.1 % — AB (ref 34.0–46.6)
Hemoglobin: 10.7 g/dL — ABNORMAL LOW (ref 11.1–15.9)
MCH: 30.1 pg (ref 26.6–33.0)
MCHC: 33.3 g/dL (ref 31.5–35.7)
MCV: 90 fL (ref 79–97)
Platelets: 242 10*3/uL (ref 150–450)
RBC: 3.56 x10E6/uL — ABNORMAL LOW (ref 3.77–5.28)
RDW: 13 % (ref 12.3–15.4)
WBC: 5.7 10*3/uL (ref 3.4–10.8)

## 2018-01-26 LAB — GC/CHLAMYDIA PROBE AMP (~~LOC~~) NOT AT ARMC
Chlamydia: NEGATIVE
Neisseria Gonorrhea: NEGATIVE

## 2018-01-31 ENCOUNTER — Encounter: Payer: Self-pay | Admitting: Obstetrics and Gynecology

## 2018-01-31 ENCOUNTER — Ambulatory Visit (INDEPENDENT_AMBULATORY_CARE_PROVIDER_SITE_OTHER): Payer: Self-pay | Admitting: Obstetrics and Gynecology

## 2018-01-31 ENCOUNTER — Ambulatory Visit (HOSPITAL_COMMUNITY)
Admission: RE | Admit: 2018-01-31 | Discharge: 2018-01-31 | Disposition: A | Discharge status: Home / Self Care | Payer: Medicaid Other | Source: Ambulatory Visit | Attending: Internal Medicine | Admitting: Internal Medicine

## 2018-01-31 ENCOUNTER — Other Ambulatory Visit: Payer: Self-pay | Admitting: Internal Medicine

## 2018-01-31 ENCOUNTER — Ambulatory Visit: Payer: Self-pay | Admitting: Obstetrics and Gynecology

## 2018-01-31 VITALS — BP 113/69 | HR 84 | Wt 125.8 lb

## 2018-01-31 DIAGNOSIS — Z1331 Encounter for screening for depression: Secondary | ICD-10-CM

## 2018-01-31 DIAGNOSIS — N938 Other specified abnormal uterine and vaginal bleeding: Secondary | ICD-10-CM

## 2018-01-31 DIAGNOSIS — N939 Abnormal uterine and vaginal bleeding, unspecified: Secondary | ICD-10-CM | POA: Diagnosis not present

## 2018-01-31 MED ORDER — NORGESTIMATE-ETH ESTRADIOL 0.25-35 MG-MCG PO TABS: 1.0000 | ORAL_TABLET | Freq: Every day | ORAL | 11 refills | Status: AC

## 2018-01-31 NOTE — Progress Notes (Signed)
27 yo here for follow up on DUB. Patient was seen on 10/23 and started on COC. She was seen on 10/25 when COC was discontinued and Megace started. Patient reports persistent vaginal bleeding which has now stopped. She denies any pelvic pain or abnormal discharge. She admits to only eating 1 meal a day. Patient desires to conceive. Prior to October, patient reports a normal monthly period of 6 days.   Past Medical History:  Diagnosis Date  . Medical history non-contributory    Past Surgical History:  Procedure Laterality Date  . CESAREAN SECTION     Family History  Problem Relation Age of Onset  . Hypertension Mother    Social History   Tobacco Use  . Smoking status: Never Smoker  . Smokeless tobacco: Never Used  Substance Use Topics  . Alcohol use: No  . Drug use: No   ROS See pertinent in HPI  Blood pressure 113/69, pulse 84, weight 125 lb 12.8 oz (57.1 kg), last menstrual period 12/10/2017, currently breastfeeding. GENERAL: Well-developed, well-nourished female in no acute distress.  ABDOMEN: Soft, nontender, nondistended. No organomegaly. PELVIC: patient declined EXTREMITIES: No cyanosis, clubbing, or edema, 2+ distal pulses.  A/P 27 yo G2P2 with DUB - Discussed with patient that a number of medical interventions were initiated within a 2-week period without the opportunity for her body to adjust to any - Offered patient expectant management and observe what her bleeding will be in November.  - Patient desires a short term trial of COC- Rx sprintec provided - Patient advised to return in 3 months if DUB persists - Patient with positive depression screen but declines to see Asher MuirJamie today

## 2018-02-06 ENCOUNTER — Inpatient Hospital Stay (HOSPITAL_COMMUNITY)
Admission: AD | Admit: 2018-02-06 | Discharge: 2018-02-06 | Disposition: A | Discharge status: Home / Self Care | Payer: Medicaid Other | Source: Ambulatory Visit | Attending: Family Medicine | Admitting: Family Medicine

## 2018-02-06 DIAGNOSIS — N939 Abnormal uterine and vaginal bleeding, unspecified: Secondary | ICD-10-CM | POA: Insufficient documentation

## 2018-02-06 DIAGNOSIS — N390 Urinary tract infection, site not specified: Secondary | ICD-10-CM | POA: Diagnosis not present

## 2018-02-06 DIAGNOSIS — N3001 Acute cystitis with hematuria: Secondary | ICD-10-CM

## 2018-02-06 DIAGNOSIS — N938 Other specified abnormal uterine and vaginal bleeding: Secondary | ICD-10-CM

## 2018-02-06 LAB — CBC
HCT: 33.5 % — ABNORMAL LOW (ref 36.0–46.0)
Hemoglobin: 11 g/dL — ABNORMAL LOW (ref 12.0–15.0)
MCH: 31.3 pg (ref 26.0–34.0)
MCHC: 32.8 g/dL (ref 30.0–36.0)
MCV: 95.2 fL (ref 80.0–100.0)
PLATELETS: 186 10*3/uL (ref 150–400)
RBC: 3.52 MIL/uL — ABNORMAL LOW (ref 3.87–5.11)
RDW: 14.3 % (ref 11.5–15.5)
WBC: 6.7 10*3/uL (ref 4.0–10.5)
nRBC: 0 % (ref 0.0–0.2)

## 2018-02-06 LAB — URINALYSIS, ROUTINE W REFLEX MICROSCOPIC
Bacteria, UA: NONE SEEN
Bilirubin Urine: NEGATIVE
GLUCOSE, UA: NEGATIVE mg/dL
Ketones, ur: NEGATIVE mg/dL
Nitrite: NEGATIVE
Protein, ur: 100 mg/dL — AB
RBC / HPF: >50 RBC/hpf — ABNORMAL HIGH (ref 0–5)
Specific Gravity, Urine: 1.024 (ref 1.005–1.030)
pH: 6 (ref 5.0–8.0)

## 2018-02-06 MED ORDER — NAPROXEN 375 MG PO TABS: 375.0000 mg | ORAL_TABLET | Freq: Two times a day (BID) | ORAL | 1 refills | Status: DC

## 2018-02-06 MED ORDER — CEPHALEXIN 500 MG PO CAPS: 500.0000 mg | ORAL_CAPSULE | Freq: Two times a day (BID) | ORAL | 0 refills | Status: DC

## 2018-02-06 MED ORDER — CEPHALEXIN 500 MG PO CAPS: 500.0000 mg | ORAL_CAPSULE | Freq: Two times a day (BID) | ORAL | 0 refills | Status: AC

## 2018-02-06 MED ORDER — CEPHALEXIN 500 MG PO CAPS: 500.0000 mg | ORAL_CAPSULE | Freq: Four times a day (QID) | ORAL | 0 refills | Status: DC

## 2018-02-06 NOTE — MAU Provider Note (Signed)
History     CSN: [id number]  Arrival date and time: 02/06/18 2009   None     Chief Complaint  Patient presents with  . Vaginal Bleeding   HPI  Amy Young is a 27 y.o. [id number] patient who presents to MAU with chief complaint of heavy vaginal bleeding. This is a recurrent problem, onset 12/27/17.  Patient endorses seeing clots on her menstrual pad throughout the day today. Denies SOB, weakness, syncope, dizziness.  Patient's husband verbalizes he is "feeling very inconvenienced" and had to cancel his gym workout this afternoon due to lack of complete resolution of bleeding.  Patient also complains of "heavy heart". She denies thoughts of sadness or self-harm. Her husband verbalizes that she is depressed "because no one can stop the bleeding".   Pertinent Gynecological History: Menses: flow is moderate prior to October 2019 Bleeding: dysfunctional uterine bleeding Contraception: OCP (estrogen/progesterone) new rx for DUB. Desires pregnancy DES exposure: denies Blood transfusions: none Sexually transmitted diseases: past history: Chlamydia, negative 01/25/18 Previous GYN Procedures: N/A   Past Medical History:  Diagnosis Date  . Medical history non-contributory     Past Surgical History:  Procedure Laterality Date  . CESAREAN SECTION      Family History  Problem Relation Age of Onset  . Hypertension Mother     Social History   Tobacco Use  . Smoking status: Never Smoker  . Smokeless tobacco: Never Used  Substance Use Topics  . Alcohol use: No  . Drug use: No    Allergies: No Known Allergies  Medications Prior to Admission  Medication Sig Dispense Refill Last Dose  . ferrous sulfate 325 (65 FE) MG tablet Take 1 tablet (325 mg total) by mouth 2 (two) times daily with a meal. 60 tablet 6 Taking  . megestrol (MEGACE) 20 MG tablet Take 20 mg by mouth daily.   Taking  . naproxen (NAPROSYN) 375 MG tablet Take 1 tablet (375 mg total) by mouth 2 (two) times daily. 20  tablet 0 Taking  . norgestimate-ethinyl estradiol (ORTHO-CYCLEN,SPRINTEC,PREVIFEM) 0.25-35 MG-MCG tablet Take 1 tablet by mouth daily. 1 Package 11   . senna-docusate (SENOKOT-S) 8.6-50 MG tablet Take 2 tablets daily by mouth. (Patient not taking: Reported on 01/31/2018) 60 tablet 0 Not Taking    Review of Systems  Gastrointestinal: Negative for abdominal pain, nausea and vomiting.  Genitourinary: Positive for vaginal bleeding and vaginal pain. Negative for flank pain.  All other systems reviewed and are negative.  Physical Exam   Blood pressure 125/70, pulse 78, temperature 98 F (36.7 C), temperature source Oral, resp. rate 20, weight 57.3 kg, last menstrual period 12/10/2017, currently breastfeeding.  Physical Exam  Nursing note and vitals reviewed. Constitutional: She is oriented to person, place, and time. She appears well-developed and well-nourished.  HENT:  Mouth/Throat: Mucous membranes are normal.  Eyes: Conjunctivae are normal.  Cardiovascular: Normal rate.  Respiratory: Effort normal. No respiratory distress.  GI: Soft. She exhibits no distension. There is no tenderness. There is no rebound and no guarding.  Neurological: She is alert and oriented to person, place, and time.  Skin: Skin is warm and dry.  Psychiatric: She has a normal mood and affect. Her behavior is normal. Judgment and thought content normal.   Throughout time in MAU patient is up in room and lobby, rocking her active toddler on her hip and moving around room with him in her arms without difficulty  MAU Course/MDM   --Patient has signed consent to use her husband as  her interpreter. During triage, he is answering on her behalf without translating dialogue. I explained to patient's husband I cannot utilize him as interpreter if he does not solicit patient's responses --All notes pertaining to HPI reviewed --She has been seen at [facility] 1023/19, [facility] with Dr. Earlene Plater 01/25/18 and [facility] with Dr.  Jolayne Panther 01/31/18 --Patient advised to manage bleeding with three month trial of OCPs. There are 5 pills missing from pill pack. Received prescription 01/31/18 --Reviewed with patient and husband that one week of pills is not enough of a hormone influence to completely resolve DUB --Patient's husband has plastic bag with multiple prescriptions from multiple ED and clinic visits. I reviewed each bottle and clarified if they should discontinue  Patient Vitals for the past 24 hrs:  BP Temp Temp src Pulse Resp Weight  02/06/18 2028 125/70 98 F (36.7 C) Oral 78 20 57.3 kg    Results for orders placed or performed during the hospital encounter of 02/06/18 (from the past 24 hour(s))  CBC     Status: Abnormal   Collection Time: 02/06/18  8:39 PM  Result Value Ref Range   WBC 6.7 4.0 - 10.5 K/uL   RBC 3.52 (L) 3.87 - 5.11 MIL/uL   Hemoglobin 11.0 (L) 12.0 - 15.0 g/dL   HCT 16.1 (L) 09.6 - 04.5 %   MCV 95.2 80.0 - 100.0 fL   MCH 31.3 26.0 - 34.0 pg   MCHC 32.8 30.0 - 36.0 g/dL   RDW 40.9 81.1 - 91.4 %   Platelets 186 150 - 400 K/uL   nRBC 0.0 0.0 - 0.2 %  Urinalysis, Routine w reflex microscopic     Status: Abnormal   Collection Time: 02/06/18  8:41 PM  Result Value Ref Range   Color, Urine YELLOW YELLOW   APPearance HAZY (A) CLEAR   Specific Gravity, Urine 1.024 1.005 - 1.030   pH 6.0 5.0 - 8.0   Glucose, UA NEGATIVE NEGATIVE mg/dL   Hgb urine dipstick LARGE (A) NEGATIVE   Bilirubin Urine NEGATIVE NEGATIVE   Ketones, ur NEGATIVE NEGATIVE mg/dL   Protein, ur 782 (A) NEGATIVE mg/dL   Nitrite NEGATIVE NEGATIVE   Leukocytes, UA TRACE (A) NEGATIVE   RBC / HPF >50 (H) 0 - 5 RBC/hpf   WBC, UA 11-20 0 - 5 WBC/hpf   Bacteria, UA NONE SEEN NONE SEEN   Squamous Epithelial / LPF 0-5 0 - 5   Mucus PRESENT    Meds ordered this encounter  Medications  . naproxen (NAPROSYN) 375 MG tablet    Sig: Take 1 tablet (375 mg total) by mouth 2 (two) times daily.    Dispense:  20 tablet    Refill:  1     Order Specific Question:   Supervising Provider    Answer:   Reva Bores [[id number]]  . cephALEXin (KEFLEX) 500 MG capsule    Sig: Take 1 capsule (500 mg total) by mouth 2 (two) times daily for 7 days.    Dispense:  14 capsule    Refill:  0    Order Specific Question:   Supervising Provider    Answer:   Reva Bores [[id number]]    Assessment and Plan  --27 y.o. [id number]  --Dysfunctional uterine bleeding --UTI, rx to pharmacy of record --Hemodynamically stable, Hgb trending up. Patient to continue PNV with separate Fe supplementation --Patient to continue three month trial of OCP --Discharge home in stable condition. Present to closest ED for new onset  SOB, chest pain, activity intolerance  F/U: Patient to schedule follow-up at [facility] in about three months.   Calvert Cantor, CNM 02/06/2018, 9:18 PM

## 2018-02-06 NOTE — Discharge Instructions (Signed)
Dysfunctional Uterine Bleeding °Dysfunctional uterine bleeding is abnormal bleeding from the uterus. Dysfunctional uterine bleeding includes: °· A period that comes earlier or later than usual. °· A period that is lighter, heavier, or has blood clots. °· Bleeding between periods. °· Skipping one or more periods. °· Bleeding after sexual intercourse. °· Bleeding after menopause. ° °Follow these instructions at home: °Pay attention to any changes in your symptoms. Follow these instructions to help with your condition: °Eating and drinking °· Eat well-balanced meals. Include foods that are high in iron, such as liver, meat, shellfish, green leafy vegetables, and eggs. °· If you become constipated: °? Drink plenty of water. °? Eat fruits and vegetables that are high in water and fiber, such as spinach, carrots, raspberries, apples, and mango. °Medicines °· Take over-the-counter and prescription medicines only as told by your health care provider. °· Do not change medicines without talking with your health care provider. °· Aspirin or medicines that contain aspirin may make the bleeding worse. Do not take those medicines: °? During the week before your period. °? During your period. °· If you were prescribed iron pills, take them as told by your health care provider. Iron pills help to replace iron that your body loses because of this condition. °Activity °· If you need to change your sanitary pad or tampon more than one time every 2 hours: °? Lie in bed with your feet raised (elevated). °? Place a cold pack on your lower abdomen. °? Rest as much as possible until the bleeding stops or slows down. °· Do not try to lose weight until the bleeding has stopped and your blood iron level is back to normal. °Other Instructions °· For two months, write down: °? When your period starts. °? When your period ends. °? When any abnormal bleeding occurs. °? What problems you notice. °· Keep all follow up visits as told by your health  care provider. This is important. °Contact a health care provider if: °· You get light-headed or weak. °· You have nausea and vomiting. °· You cannot eat or drink without vomiting. °· You feel dizzy or have diarrhea while you are taking medicines. °· You are taking birth control pills or hormones, and you want to change them or stop taking them. °Get help right away if: °· You develop a fever or chills. °· You need to change your sanitary pad or tampon more than one time per hour. °· Your bleeding becomes heavier, or your flow contains clots more often. °· You develop pain in your abdomen. °· You lose consciousness. °· You develop a rash. °This information is not intended to replace advice given to you by your health care provider. Make sure you discuss any questions you have with your health care provider. °Document Released: 03/04/2000 Document Revised: 08/13/2015 Document Reviewed: 06/02/2014 °Elsevier Interactive Patient Education © 2018 [employer] ° °

## 2018-02-06 NOTE — MAU Note (Signed)
PT DOES NOT SPEAK ENGLISH -  HUSBAND   SPEAKS -    SHE WAS  HERE 2 WEEKS AGO - . LAST WEEK IN CLINIC.   NOT PREG - ABNL  VAG BLEEDING.   IN CLINIC - CHANGES  MEDS. .   IN TRIAGE - SMALL  AMT OF RED ON PAD .  TOOK NAPROXEM  AT 11 AM-   PAIN  NOW- 6 .

## 2018-02-13 ENCOUNTER — Telehealth: Payer: Self-pay | Admitting: Certified Nurse Midwife

## 2018-02-13 ENCOUNTER — Ambulatory Visit: Payer: Self-pay | Admitting: Obstetrics and Gynecology

## 2018-02-13 NOTE — Telephone Encounter (Signed)
Pt calling to ask why she was prescribed Keflex on 11/19, explained that was for UTI. Also reports continuing to bleed small amt after starting OCPs, been taking for 2 weeks. Recommend f/u in office [facility] if continues to having daily bleeding. Return to nearest ED for soaking pad over hour or less. Husband interpreted information for pt.

## 2018-02-26 ENCOUNTER — Encounter: Payer: Self-pay | Admitting: Family Medicine

## 2018-02-26 ENCOUNTER — Ambulatory Visit (INDEPENDENT_AMBULATORY_CARE_PROVIDER_SITE_OTHER): Payer: Self-pay | Admitting: Family Medicine

## 2018-02-26 VITALS — BP 115/76 | HR 84 | Wt 124.0 lb

## 2018-02-26 DIAGNOSIS — N938 Other specified abnormal uterine and vaginal bleeding: Secondary | ICD-10-CM

## 2018-02-26 LAB — POCT URINALYSIS DIP (DEVICE)
Bilirubin Urine: NEGATIVE
Glucose, UA: NEGATIVE mg/dL
Ketones, ur: NEGATIVE mg/dL
Leukocytes, UA: NEGATIVE
Nitrite: NEGATIVE
Protein, ur: NEGATIVE mg/dL
Specific Gravity, Urine: 1.02 (ref 1.005–1.030)
Urobilinogen, UA: 0.2 mg/dL (ref 0.0–1.0)
pH: 5.5 (ref 5.0–8.0)

## 2018-02-26 NOTE — Progress Notes (Signed)
Pt would like to discuss birth control. STarted BCPs about a month ago and since starting has ulcers in mouth. Also some constipation. Currently taking multivitamins. Also having irreg bleeding.

## 2018-02-27 LAB — TSH: TSH: 2.05 u[IU]/mL (ref 0.450–4.500)

## 2018-02-27 LAB — CBC
HEMOGLOBIN: 12.5 g/dL (ref 11.1–15.9)
Hematocrit: 38.1 % (ref 34.0–46.6)
MCH: 31.3 pg (ref 26.6–33.0)
MCHC: 32.8 g/dL (ref 31.5–35.7)
MCV: 96 fL (ref 79–97)
Platelets: 201 10*3/uL (ref 150–450)
RBC: 3.99 x10E6/uL (ref 3.77–5.28)
RDW: 12.2 % — AB (ref 12.3–15.4)
WBC: 4.9 10*3/uL (ref 3.4–10.8)

## 2018-02-27 LAB — ANA: ANA: NEGATIVE

## 2018-02-27 NOTE — Progress Notes (Signed)
Subjective:    Amy Young - 27 y.o. female MRN [id number]  Date of birth: 10-14-1990  HPI  Amy Young is a 27 y.o. [phone] female here for DUB. She reports that her last normal period was in September. Starting around October 7th she's had nearly daily bleeding with clots that were concerning to her and her husband. She has been seen several times for this here in clinic and several times in the MAU. She's been treated with megace and different types of OCPs with some improvement, but not complete resolution of her symptoms. The last OCP she was prescribed was sprintec, but since starting it, she's had ulcers in her mouth that prevent her from eating and decreased appetite. She has seen improvement on this medication, but does not want to continue it due to these symptoms. At this time, she does not have any symptoms of anemia such as SOB, palpitations or feeling light headed or dizzy when she walks.  She and her husband would like to become pregnant, but also would like this issue resolved prior to trying to get pregnant. She states that she would like a different treatment to resolve this issue prior to trying to get pregnant. Declines depo because she thinks 3 months is too long to wait.   OB History    Gravida  2   Para  2   Term  2   Preterm      AB      Living  2     SAB      TAB      Ectopic      Multiple  0   Live Births  1             Health Maintenance:  Normal pap 06/2016.   Health Maintenance Due  Topic Date Due  . TETANUS/TDAP  07/10/2008  . PAP SMEAR  07/11/2010  . INFLUENZA VACCINE  10/19/2017    -  reports that she has never smoked. She has never used smokeless tobacco. - Review of Systems: Per HPI. - Past Medical History: Patient Active Problem List   Diagnosis Date Noted  . Chlamydia 01/12/2018   - Medications: reviewed and updated   Objective:   Physical Exam BP 115/76   Pulse 84   Wt 124 lb (56.2 kg)   LMP 12/11/2017   BMI 19.42  kg/m  Gen: NAD, alert, cooperative with exam, well-appearing HEENT: NCAT, PERRL, clear conjunctiva CV: RRR Resp: non-labored Skin: no rashes, normal turgor  Neuro: no gross deficits.  Psych: good insight, alert and oriented GU/GYN: deferred    Assessment & Plan:   1. Dysfunctional uterine bleeding - After a lengthy discussion with patient, her husband and Dr. Debroah Loop (on call), we decided to stop all treatment for DUB at this time, given that patient had normal periods prior to all the treatment she's had in the last 2 months, they will likely normalize again with time and patience. Patient and husband seem very distressed about the bleeding, however attempted to provide reassurance. Will check labs today and have close follow up with provider previously seen since patient feels that they are repeating their story each time.  - CBC - TSH - Antinuclear Antib (ANA) - POCT Urinalysis Dipstick  Routine preventative health maintenance measures emphasized. Please refer to After Visit Summary for other counseling recommendations.   Return in about 2 weeks (around 03/12/2018) for DUB check in. Please schedule with previously seen provider. .  75% of  40 min visit spent on counseling and coordination of care.   Gwenevere AbbotNimeka Sanyla Summey, MD  OB Fellow  02/27/2018, 11:00 PM

## 2018-03-12 ENCOUNTER — Ambulatory Visit: Payer: Self-pay | Admitting: Family Medicine

## 2018-03-26 ENCOUNTER — Ambulatory Visit: Payer: Self-pay | Admitting: Obstetrics and Gynecology

## 2018-04-14 IMAGING — DX DG CHEST 2V
2 series · 2 of 2 positions shown · non-contrast
Comparison: None.

CLINICAL DATA: Fever and sore throat

EXAM:
CHEST  2 VIEW

[chest pa]
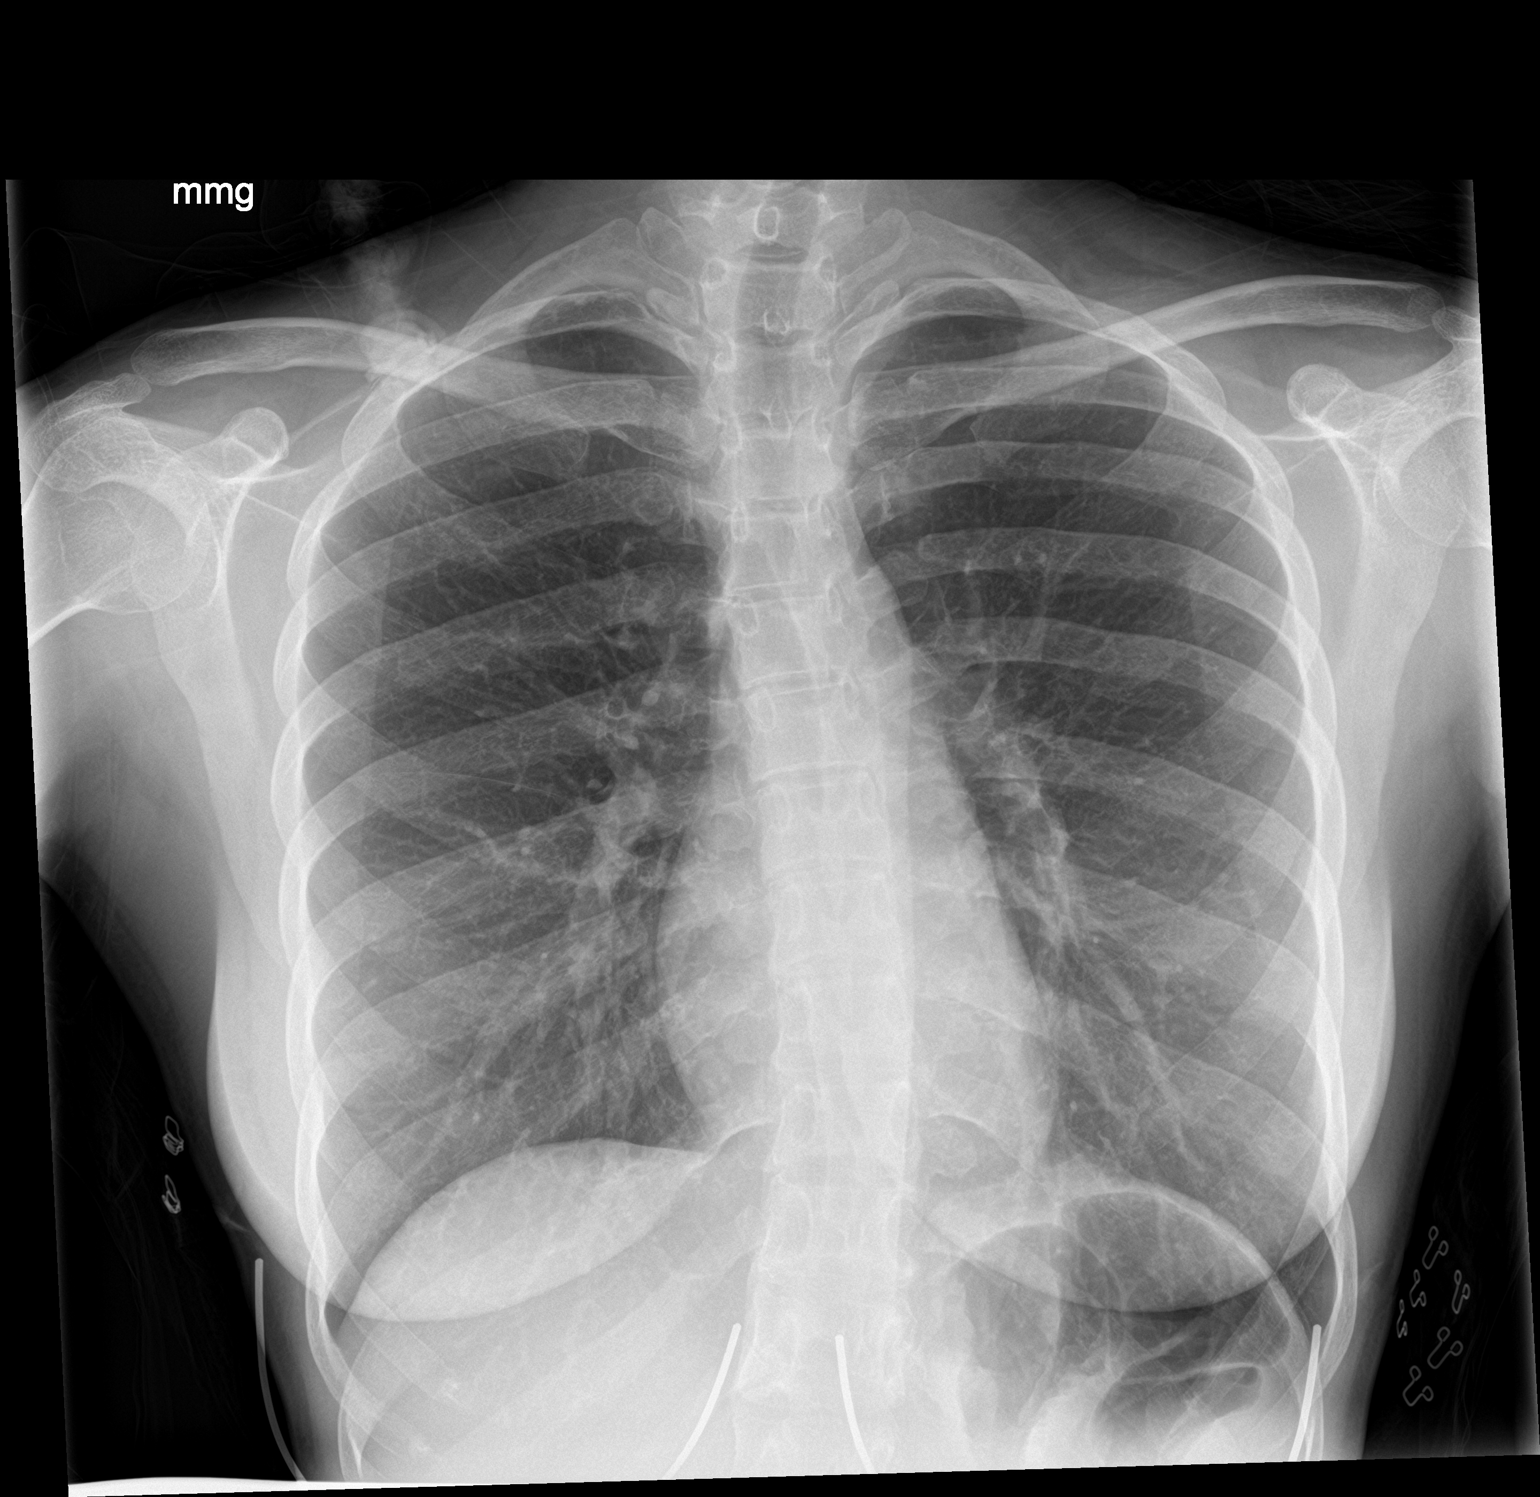

[chest lat]
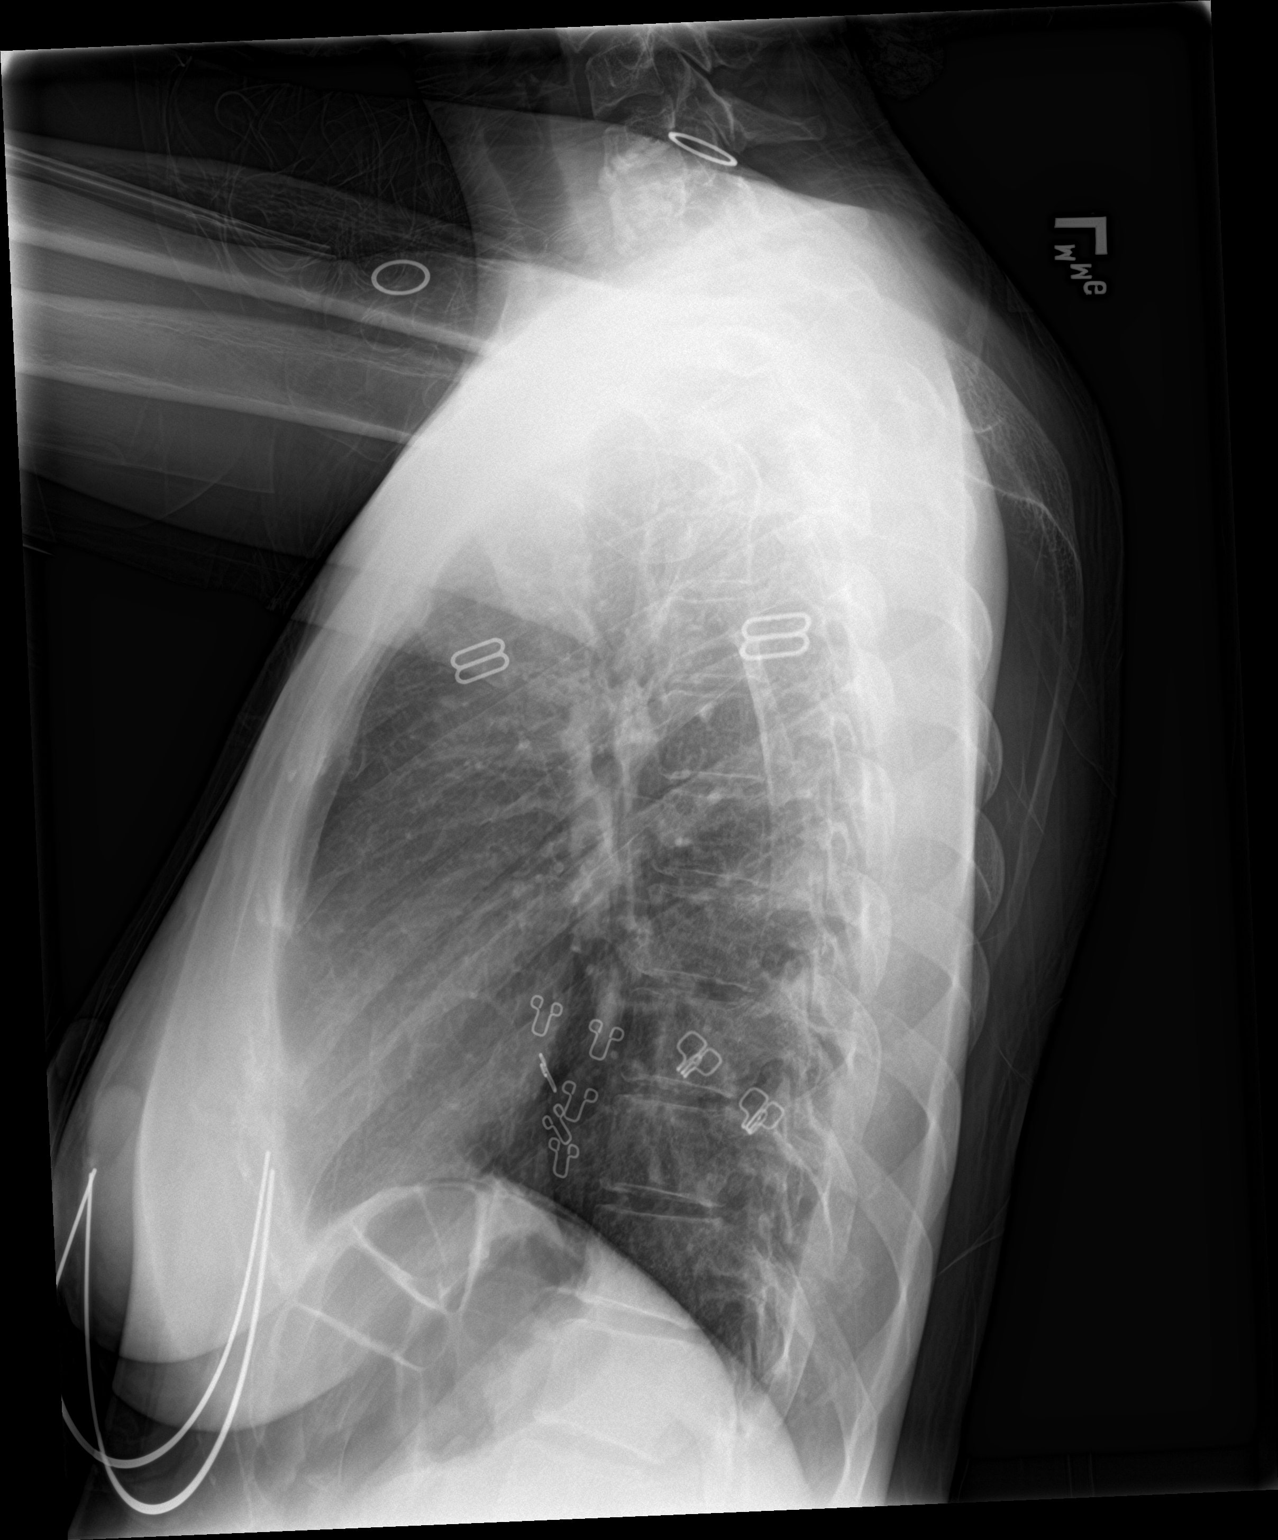

[2 of 2 positions shown; findings below may reference images not displayed]

FINDINGS: The heart size and mediastinal contours are within normal limits.
Both lungs are clear. Mild S-shaped scoliosis of the thoracolumbar
spine.
IMPRESSION: No active cardiopulmonary disease. S-shaped thoracolumbar scoliosis.

## 2018-05-19 IMAGING — US US MFM FETAL NUCHAL TRANSLUCENCY
1 series · 15 of 28 positions shown · non-contrast
Comparison: none

[Series 1: us mfm fetal nuchal translucency · 15 of 34 slices shown]
[im 1/34]
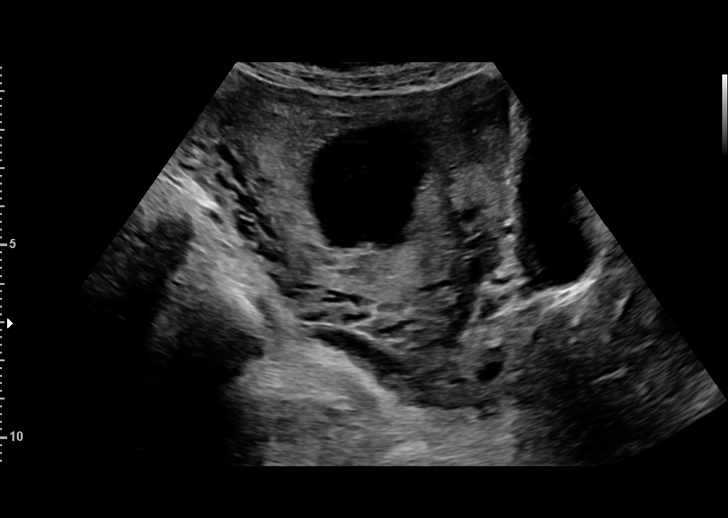
[im 3/34]
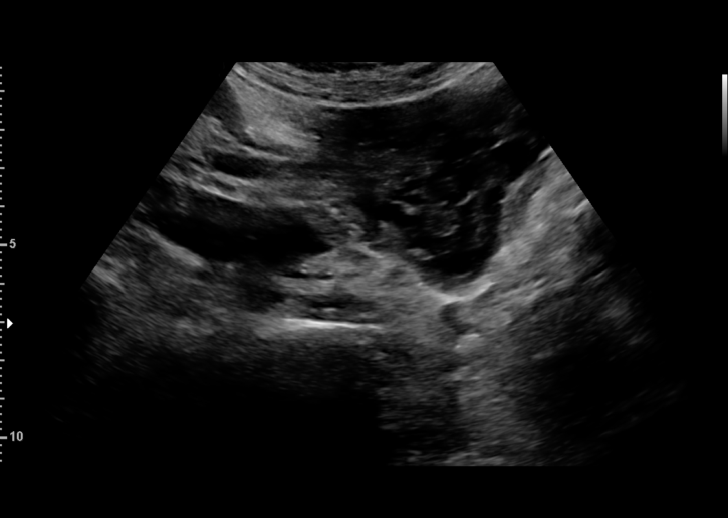
[im 5/34]
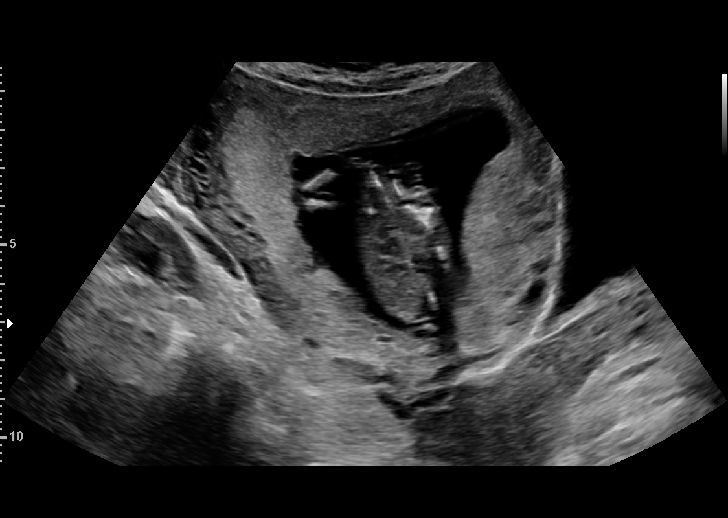
[im 8/34]
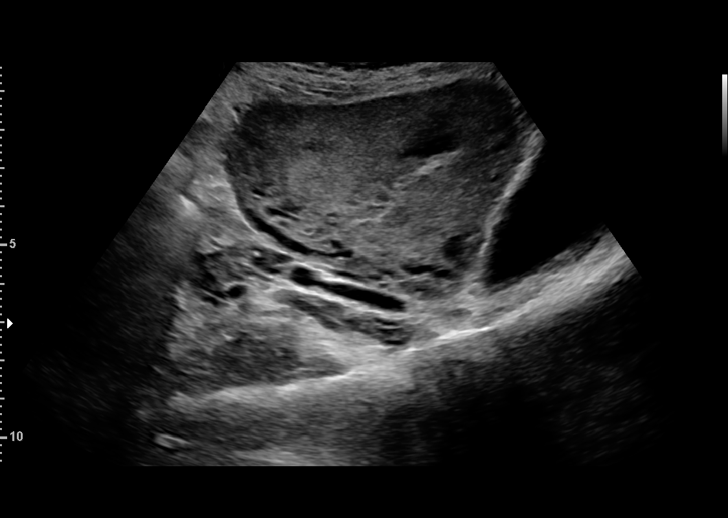
[im 10/34]
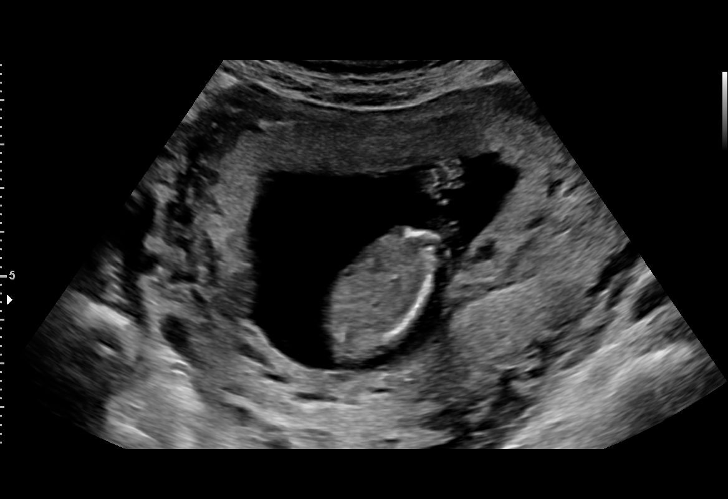
[im 13/34]
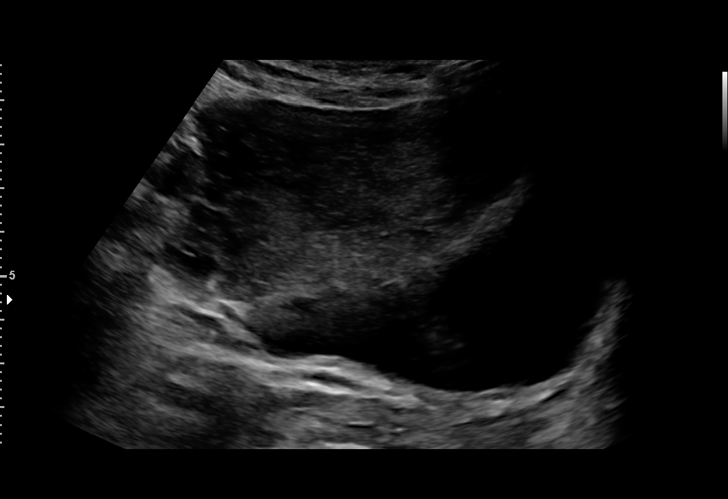
[im 15/34]
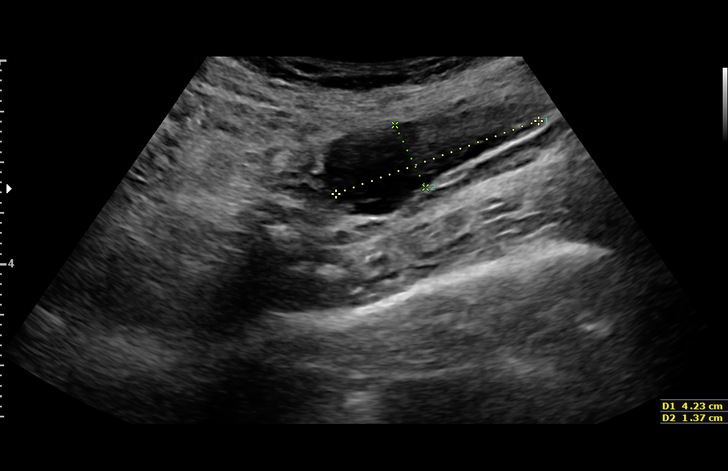
[im 18/34]
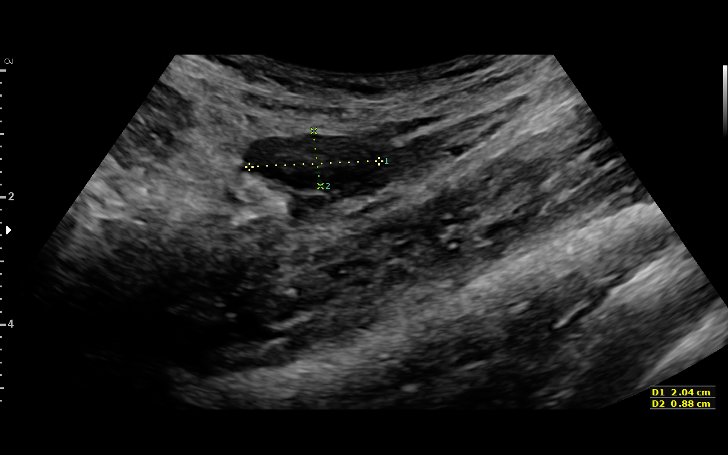
[im 19/34]
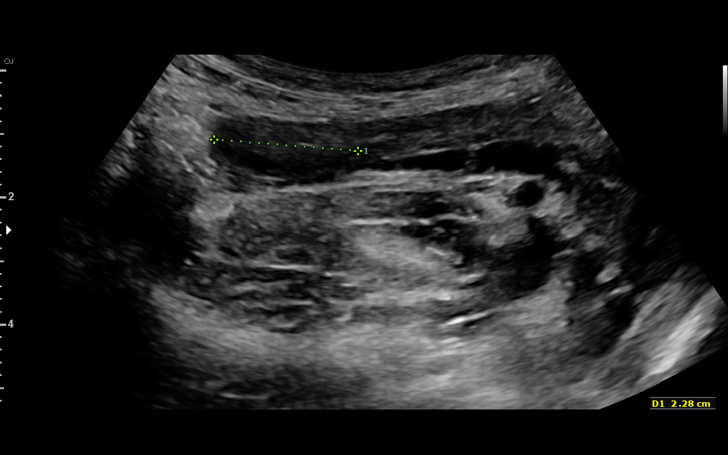
[im 21/34]
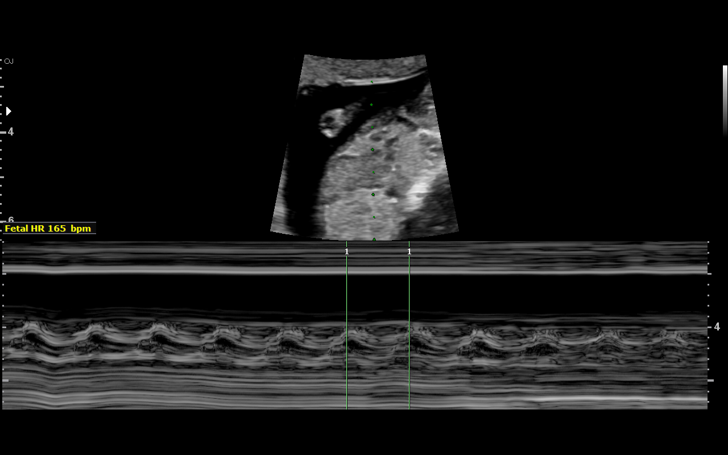
[im 24/34]
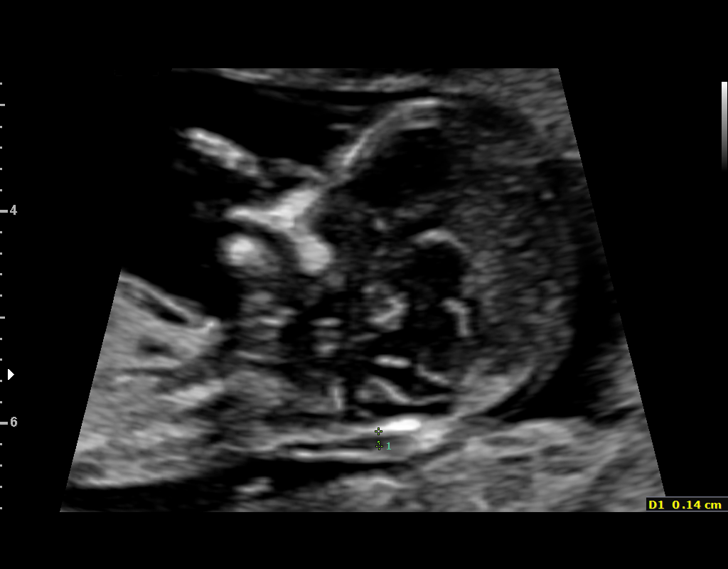
[im 26/34]
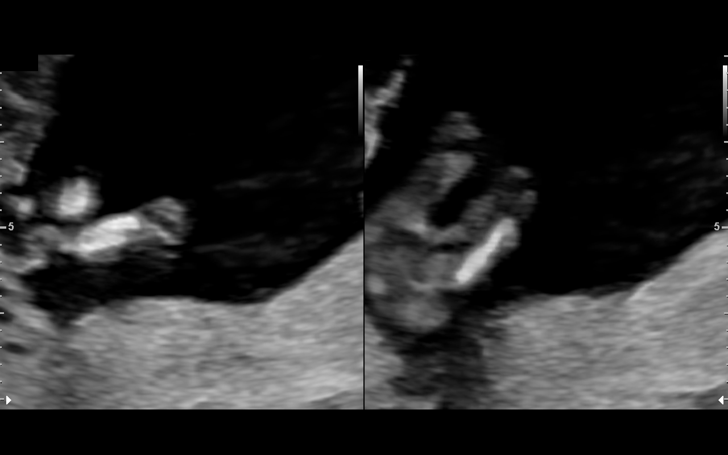
[im 29/34]
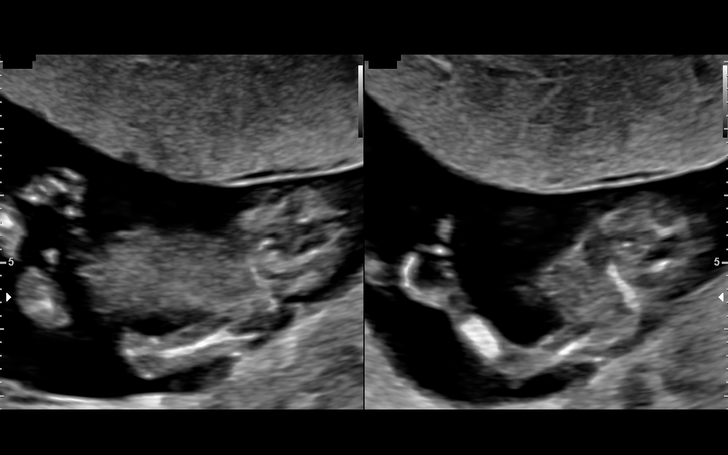
[im 31/34]
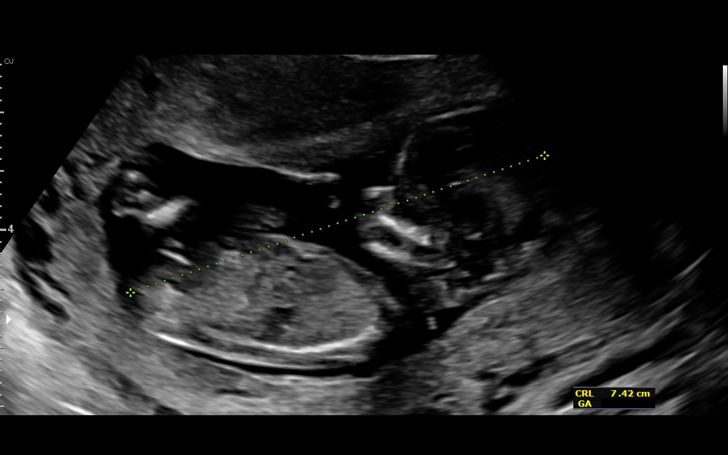
[im 34/34]
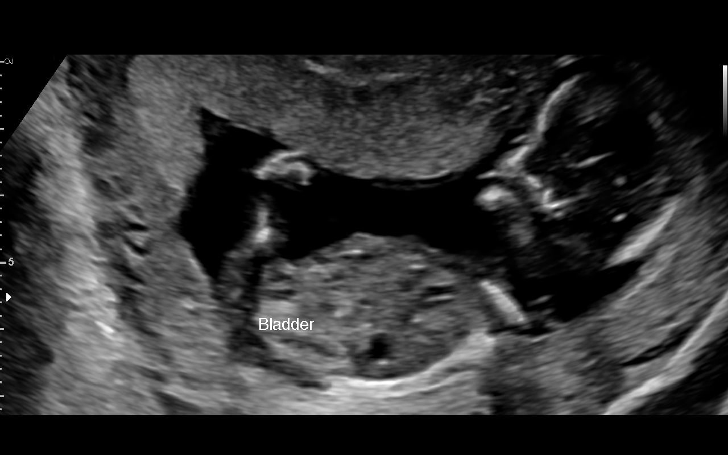

[15 of 28 positions shown; findings below may reference images not displayed]

SERAH NP

TRANSLUCENCY

1  PAULINO CANGANJO BARONESA           [id number]      [id number]     [id number]
Indications

13 weeks gestation of pregnancy
Encounter for nuchal translucency
Consanguinity (FOB 1st cousin)
OB History

Gravidity:    2         Term:   1        Prem:   0        SAB:   0
TOP:          0       Ectopic:  0        Living: 1
Fetal Evaluation

Num Of Fetuses:     1
Preg. Location:     Intrauterine
Gest. Sac:          Intrauterine
Fetal Pole:         Visualized
Fetal Heart         165
Rate(bpm):
Cardiac Activity:   Observed
Biometry

CRL:      77.2  mm     G. Age:  13w 3d                  EDD:   01/29/17
Gestational Age

LMP:           13w 6d       Date:   04/21/16                 EDD:   01/26/17
Best:          13w 6d    Det. By:   LMP  (04/21/16)          EDD:   01/26/17
1st Trimester Genetic Sonogram Screening

CRL:            77.2  mm    G. Age:   13w 3d                 EDD:   01/29/17
Nuc Trans:       1.4  mm
Nasal Bone:                 Present
Anatomy

Cranium:               Appears normal         Abdomen:                Appears normal
Choroid Plexus:        Appears normal         Upper Extremities:      Visualized
Stomach:               Appears normal, left   Lower Extremities:      Visualized
sided
Impression

Singleton intrauterine pregnancy at 13 weeks 6 days
gestation with fetal cardiac activity
NT 1.4mm
NB present
Recommendations

Desires first trimester screen after counseling by primary
provider
Will check today
Recommend follow-up ultrasound examination at 
weeks gestation for fetal anatomic survey

## 2018-11-01 ENCOUNTER — Encounter (HOSPITAL_BASED_OUTPATIENT_CLINIC_OR_DEPARTMENT_OTHER): Payer: Self-pay | Admitting: Emergency Medicine

## 2018-11-01 ENCOUNTER — Other Ambulatory Visit: Payer: Self-pay

## 2018-11-01 ENCOUNTER — Emergency Department (HOSPITAL_BASED_OUTPATIENT_CLINIC_OR_DEPARTMENT_OTHER)
Admission: EM | Admit: 2018-11-01 | Discharge: 2018-11-01 | Disposition: A | Discharge status: Home / Self Care | Payer: Medicaid Other | Attending: Emergency Medicine | Admitting: Emergency Medicine

## 2018-11-01 DIAGNOSIS — K0889 Other specified disorders of teeth and supporting structures: Secondary | ICD-10-CM | POA: Diagnosis not present

## 2018-11-01 DIAGNOSIS — Z79899 Other long term (current) drug therapy: Secondary | ICD-10-CM | POA: Insufficient documentation

## 2018-11-01 DIAGNOSIS — J0100 Acute maxillary sinusitis, unspecified: Secondary | ICD-10-CM | POA: Insufficient documentation

## 2018-11-01 HISTORY — DX: Typhoid fever, unspecified: A01.00

## 2018-11-01 MED ORDER — AMOXICILLIN-POT CLAVULANATE 875-125 MG PO TABS: 1.0000 | ORAL_TABLET | Freq: Two times a day (BID) | ORAL | 0 refills | Status: DC

## 2018-11-01 MED ORDER — HYDROCODONE-ACETAMINOPHEN 5-325 MG PO TABS
1.0000 | ORAL_TABLET | Freq: Once | ORAL | Status: AC
  Administered 2018-11-01: 1 via ORAL
  Filled 2018-11-01: qty 1

## 2018-11-01 MED ORDER — FLUTICASONE PROPIONATE 50 MCG/ACT NA SUSP: 1.0000 | Freq: Every day | NASAL | 2 refills | Status: AC

## 2018-11-01 MED ORDER — HYDROCODONE-ACETAMINOPHEN 5-325 MG PO TABS: 1.0000 | ORAL_TABLET | Freq: Four times a day (QID) | ORAL | 0 refills | Status: DC | PRN

## 2018-11-01 MED ORDER — OMEPRAZOLE 20 MG PO CPDR: 20.0000 mg | DELAYED_RELEASE_CAPSULE | Freq: Every day | ORAL | 2 refills | Status: AC

## 2018-11-01 NOTE — ED Notes (Signed)
Pt verbalized understanding of dc instructions.

## 2018-11-01 NOTE — ED Triage Notes (Signed)
Spouse states pt has been having pain on the left side of her head, left ear ache, and left neck pain for the past two months  Two days ago she has been having a toothache on the left lower back molar  Pt has been using over the counter medications without relief  Last night states pt felt like her heart was racing and would get dizzy when she stood up

## 2018-11-01 NOTE — ED Provider Notes (Signed)
[facility] Provider Note   CSN: [id number] Arrival date & time: 11/01/18  7591     History   Chief Complaint Chief Complaint  Patient presents with   Dental Pain   Headache    HPI Amy Young is a 28 y.o. female.     Patient is a 28 year old female with no significant past medical history who presents today with multiple complaints.  Patient's husband gives the history as she does not speak [place].  He reports that for the last 2 months she has had itchiness in the left side of her throat, intermittent facial pain and headaches and ear pain.  She has been sneezing but has not had significant cough and no fever.  She has been trying over-the-counter medication such as Claritin and another over-the-counter allergy pill but it does not seem to be helping.  In the last 2 days the pain has become extremely worse now a 9 out of 10 and makes it so she is unable to sleep.  She states she gets a throbbing sensation in the left side of her head and her ear that and radiates down into her throat.  She is tried taking ibuprofen, Tylenol but nothing seems to help with the pain.  Also in the last few days she has developed severe left lower dental pain which is worse when she opens her mouth.  Her husband reports that she is not eating and has been miserable.  She does not have a regular doctor and has not seen anybody for this problem.  Nobody else in the home is ill.  The history is provided by the spouse and the patient. The history is limited by a language barrier. A language interpreter was used.  Dental Pain Associated symptoms: headaches   Headache Associated symptoms: fatigue     Past Medical History:  Diagnosis Date   Medical history non-contributory    Typhoid     Patient Active Problem List   Diagnosis Date Noted   Chlamydia 01/12/2018    Past Surgical History:  Procedure Laterality Date   CESAREAN SECTION       OB History    Gravida   2   Para  2   Term  2   Preterm      AB      Living  2     SAB      TAB      Ectopic      Multiple  0   Live Births  1            Home Medications    Prior to Admission medications   Medication Sig Start Date End Date Taking? Authorizing Provider  amoxicillin-clavulanate (AUGMENTIN) 875-125 MG tablet Take 1 tablet by mouth every 12 (twelve) hours. 11/01/18   Blanchie Dessert, MD  ferrous sulfate 325 (65 FE) MG tablet Take 1 tablet (325 mg total) by mouth 2 (two) times daily with a meal. Patient not taking: Reported on 02/26/2018 01/12/18   Laury Deep, CNM  fluticasone (FLONASE) 50 MCG/ACT nasal spray Place 1 spray into both nostrils daily. 11/01/18   Blanchie Dessert, MD  HYDROcodone-acetaminophen (NORCO/VICODIN) 5-325 MG tablet Take 1 tablet by mouth every 6 (six) hours as needed for severe pain. 11/01/18   Blanchie Dessert, MD  Multiple Vitamin (MULTIVITAMIN WITH MINERALS) TABS tablet Take 1 tablet by mouth daily.    [provider]  naproxen (NAPROSYN) 375 MG tablet Take 1 tablet (375 mg total)  by mouth 2 (two) times daily. Patient not taking: Reported on 02/26/2018 02/06/18   Calvert CantorWeinhold, Samantha C, CNM  norgestimate-ethinyl estradiol (ORTHO-CYCLEN,SPRINTEC,PREVIFEM) 0.25-35 MG-MCG tablet Take 1 tablet by mouth daily. 01/31/18   Constant, Peggy, MD  omeprazole (PRILOSEC) 20 MG capsule Take 1 capsule (20 mg total) by mouth daily. 11/01/18   Gwyneth SproutPlunkett, Isobel Eisenhuth, MD  senna-docusate (SENOKOT-S) 8.6-50 MG tablet Take 2 tablets daily by mouth. Patient not taking: Reported on 01/31/2018 01/30/17   Minott, Lynnae PrudeKeriann S, MD    Family History Family History  Problem Relation Age of Onset   Hypertension Mother     Social History Social History   Tobacco Use   Smoking status: Never Smoker   Smokeless tobacco: Never Used  Substance Use Topics   Alcohol use: No   Drug use: No     Allergies   Patient has no known allergies.   Review of  Systems Review of Systems  Constitutional: Positive for appetite change and fatigue.       Husband reports sometimes when she stands too long she then starts to feel weak and needs to sit down  Genitourinary:       Ongoing issues with irregular periods.  Husband states that they are trying to become pregnant and been trying since December without success.  Last menses was this week and lasted for a total of 8 days.  Neurological: Positive for headaches.  All other systems reviewed and are negative.    Physical Exam Updated Vital Signs BP 128/72    Pulse 77    Temp 98.3 F (36.8 C) (Oral)    Resp 16    Ht 5\' 6"  (1.676 m)    Wt 55 kg    LMP 10/20/2018 (Exact Date)    SpO2 100%    BMI 19.57 kg/m   Physical Exam Vitals signs and nursing note reviewed.  Constitutional:      General: She is not in acute distress.    Appearance: She is well-developed and normal weight.     Comments: Appears uncomfortable  HENT:     Head: Normocephalic and atraumatic.     Jaw: Pain on movement present. No swelling.     Right Ear: Tympanic membrane normal.     Left Ear: A middle ear effusion is present.     Nose:     Right Turbinates: Not enlarged or swollen.     Left Turbinates: Enlarged and swollen.     Left Sinus: Maxillary sinus tenderness and frontal sinus tenderness present.     Mouth/Throat:     Mouth: Mucous membranes are moist.     Pharynx: Oropharynx is clear.   Eyes:     Extraocular Movements: Extraocular movements intact.     Pupils: Pupils are equal, round, and reactive to light.  Cardiovascular:     Rate and Rhythm: Normal rate and regular rhythm.     Heart sounds: Normal heart sounds. No murmur. No friction rub.  Pulmonary:     Effort: Pulmonary effort is normal.     Breath sounds: Normal breath sounds. No wheezing or rales.  Abdominal:     General: Bowel sounds are normal. There is no distension.     Palpations: Abdomen is soft.     Tenderness: There is no abdominal tenderness.  There is no guarding or rebound.  Musculoskeletal: Normal range of motion.        General: No tenderness.     Comments: No edema  Skin:    General:  Skin is warm and dry.     Findings: No rash.  Neurological:     Mental Status: She is alert and oriented to person, place, and time. Mental status is at baseline.     Cranial Nerves: No cranial nerve deficit.  Psychiatric:        Mood and Affect: Mood normal.        Behavior: Behavior normal.        Thought Content: Thought content normal.      ED Treatments / Results  Labs (all labs ordered are listed, but only abnormal results are displayed) Labs Reviewed - No data to display  EKG None  Radiology No results found.  Procedures Procedures (including critical care time)  Medications Ordered in ED Medications  HYDROcodone-acetaminophen (NORCO/VICODIN) 5-325 MG per tablet 1 tablet (1 tablet Oral Given 11/01/18 0737)     Initial Impression / Assessment and Plan / ED Course  I have reviewed the triage vital signs and the nursing notes.  Pertinent labs & imaging results that were available during my care of the patient were reviewed by me and considered in my medical decision making (see chart for details).        28 year old healthy female presenting today with 2 months of worsening left-sided symptoms that have become acutely worse in the last 2 to 3 days.  This seems to be sinus related as it is all on the left side and will precipitate headaches.  In the last 2 days it is been excruciating.  Concern for possible developing infection.  Patient has had chills and felt generally weak and decreased appetite but no notable fevers.  Patient has significant swelling of her turbinates on the left side as well as middle ear effusion.  She is also having facial pain.  Patient is also complaining of left lower wisdom tooth pain.  Some mild gum erythema and tenderness with palpation but no obvious decay.  Suspect sinus infection and  patient was started on Flonase she is already taking Claritin and given Augmentin.  She was given a short course of pain control as she has been taking more than the recommended amount of ibuprofen without improvement is now having some stomach discomfort consistent with gastritis.  Has been states that she is taking omeprazole before but is not taking anything currently.  Recommended decreasing the amount of ibuprofen using the pain medication sparingly and starting the nasal spray and antibiotic.  Patient was encouraged to follow-up if symptoms are not improving.  Low suspicion at this time for PTA, epiglottitis, intracranial cause of her symptoms.  Also patient given resources for a dentist if symptoms do not improve may have to have wisdom teeth further evaluated.  Final Clinical Impressions(s) / ED Diagnoses   Final diagnoses:  Pain, dental  Acute non-recurrent maxillary sinusitis    ED Discharge Orders         Ordered    omeprazole (PRILOSEC) 20 MG capsule  Daily     11/01/18 0730    fluticasone (FLONASE) 50 MCG/ACT nasal spray  Daily     11/01/18 0730    amoxicillin-clavulanate (AUGMENTIN) 875-125 MG tablet  Every 12 hours     11/01/18 0730    HYDROcodone-acetaminophen (NORCO/VICODIN) 5-325 MG tablet  Every 6 hours PRN     11/01/18 0730           Gwyneth SproutPlunkett, Jediah Horger, MD 11/01/18 [phone]

## 2018-11-01 NOTE — Discharge Instructions (Signed)
It is important for you to get a regular doctor.  If the sinuses do not improve may need to be seen by a specialist.  Also if the tooth pain continues may need to see a dentist and list is attached of dentist in this area.  Also follow up with the OB/Gyn if you continue to have trouble getting pregnant sometimes they have to check the hormones and will start medicatioin like (clomid) to help with fertility but you can get an over the counter ovulation kit for now to see if you are ovulating.

## 2019-01-23 ENCOUNTER — Other Ambulatory Visit: Payer: Self-pay | Admitting: Acute Care

## 2019-01-23 ENCOUNTER — Encounter: Payer: Self-pay | Admitting: Internal Medicine

## 2019-01-23 ENCOUNTER — Other Ambulatory Visit: Payer: Self-pay

## 2019-01-23 ENCOUNTER — Ambulatory Visit: Payer: Medicaid Other | Admitting: Internal Medicine

## 2019-01-23 ENCOUNTER — Telehealth: Payer: Self-pay | Admitting: Internal Medicine

## 2019-01-23 DIAGNOSIS — R05 Cough: Secondary | ICD-10-CM | POA: Diagnosis not present

## 2019-01-23 DIAGNOSIS — R059 Cough, unspecified: Secondary | ICD-10-CM

## 2019-01-23 DIAGNOSIS — J329 Chronic sinusitis, unspecified: Secondary | ICD-10-CM

## 2019-01-23 DIAGNOSIS — J3089 Other allergic rhinitis: Secondary | ICD-10-CM

## 2019-01-23 MED ORDER — DIPHENHYDRAMINE HCL 25 MG PO TABS: 25.0000 mg | ORAL_TABLET | Freq: Every day | ORAL | 11 refills | Status: AC

## 2019-01-23 MED ORDER — MONTELUKAST SODIUM 10 MG PO TABS: 10.0000 mg | ORAL_TABLET | Freq: Every day | ORAL | 11 refills | Status: DC

## 2019-01-23 MED ORDER — MONTELUKAST SODIUM 10 MG PO TABS: 10.0000 mg | ORAL_TABLET | Freq: Every day | ORAL | 11 refills | Status: AC

## 2019-01-23 NOTE — Patient Instructions (Signed)
Flonase - 1 spray on each side of your nose twice a day for first week, then 1 spray on each side.   Instructions for use:  If you also use a saline nasal spray or rinse, use that first.  Position the head with the chin slightly tucked. Use the right hand to spray into the left nostril and the right hand to spray into the left nostril.   Point the bottle away from the septum of your nose (cartilage that divides the two sides of your nose).   Hold the nostril closed on the opposite side from where you will spray  Spray once and gently sniff to pull the medicine into the higher parts of your nose.  Don't sniff too hard as the medicine will drain down the back of your throat instead.  Repeat with a second spray on the same side if prescribed.  Repeat on the other side of your nose.  Take Singulair in the morning 1 pill once/day.  Take the allergy pill - benadryl at night time 1 pill.

## 2019-01-23 NOTE — Progress Notes (Signed)
Amy Young    [id number]    1990-05-25  Primary Care Physician:Patient, No Pcp Per Date of Appointment: 01/23/2019 New Patient Evaluation, Self Referred  Chief complaint:  cough  HPI: The patient is Urdu speaking only.  History is obtained from the patient's husband.  He describes a cough which started 3 to 4 weeks ago, with associated symptoms of itchy/watery eyes, runny nose. And sore throat.  Patient has also had some nasal congestion and blockage. The cough is all day but is worse at night time.  Cough is nonproductive, without hemoptysis.  +sneezing.  Nighttime symptoms are the most concerning.  She tried flonase once/day for 10 days with no benefit. She has been to the urgent care for multiple visits and has tried several over-the-counter cough syrups allergy medications. She did have some benefit from an intramuscular steroid shot from urgent care.  Allegra has been of some benefit. She is currently taking omeprazole for abdominal pain but denies reflux. No fevers, chills night sweats or weight loss.  The patient is a never smoker, but her husband does smoke.  She denies any chest tightness wheezing or difficulty completing ADLs.  She has no childhood history of asthma or allergies.  Her husband had similar symptoms which exacerbated this year as well.  He sees Dr. Melvyn Novas.   Social history: She lives at home with her son and husband.  She is a never smoker but does have secondhand smoke exposure, she has been in this country from [place] for the last 4 years.  Outpatient Encounter Medications as of 01/23/2019  Medication Sig  . cetirizine (ZYRTEC) 10 MG tablet Take 10 mg by mouth daily.  Marland Kitchen dextromethorphan-guaiFENesin (MUCINEX DM) 30-600 MG 12hr tablet Take 1 tablet by mouth 2 (two) times daily.  . fexofenadine (ALLEGRA) 180 MG tablet Take 180 mg by mouth daily.  . fluticasone (FLONASE) 50 MCG/ACT nasal spray Place 1 spray into both nostrils daily.  . Multiple Vitamin  (MULTIVITAMIN WITH MINERALS) TABS tablet Take 1 tablet by mouth daily.  . norgestimate-ethinyl estradiol (ORTHO-CYCLEN,SPRINTEC,PREVIFEM) 0.25-35 MG-MCG tablet Take 1 tablet by mouth daily.  Marland Kitchen omeprazole (PRILOSEC) 20 MG capsule Take 1 capsule (20 mg total) by mouth daily.  . diphenhydrAMINE (BENADRYL) 25 MG tablet Take 1 tablet (25 mg total) by mouth at bedtime.  . montelukast (SINGULAIR) 10 MG tablet Take 1 tablet (10 mg total) by mouth at bedtime.  . [DISCONTINUED] amoxicillin-clavulanate (AUGMENTIN) 875-125 MG tablet Take 1 tablet by mouth every 12 (twelve) hours.  . [DISCONTINUED] ferrous sulfate 325 (65 FE) MG tablet Take 1 tablet (325 mg total) by mouth 2 (two) times daily with a meal. (Patient not taking: Reported on 02/26/2018)  . [DISCONTINUED] HYDROcodone-acetaminophen (NORCO/VICODIN) 5-325 MG tablet Take 1 tablet by mouth every 6 (six) hours as needed for severe pain.  . [DISCONTINUED] naproxen (NAPROSYN) 375 MG tablet Take 1 tablet (375 mg total) by mouth 2 (two) times daily. (Patient not taking: Reported on 02/26/2018)  . [DISCONTINUED] senna-docusate (SENOKOT-S) 8.6-50 MG tablet Take 2 tablets daily by mouth. (Patient not taking: Reported on 01/31/2018)   No facility-administered encounter medications on file as of 01/23/2019.     Allergies as of 01/23/2019 - Review Complete 01/23/2019  Allergen Reaction Noted  . Progesterone Other (See Comments) and Palpitations 12/20/2018    Past Medical History:  Diagnosis Date  . Medical history non-contributory   . Typhoid     Past Surgical History:  Procedure Laterality Date  .  CESAREAN SECTION      Family History  Problem Relation Age of Onset  . Hypertension Mother     Social History   Socioeconomic History  . Marital status: Married    Spouse name: Not on file  . Number of children: Not on file  . Years of education: Not on file  . Highest education level: Not on file  Occupational History  . Not on file  Social Needs   . Financial resource strain: Not on file  . Food insecurity    Worry: Not on file    Inability: Not on file  . Transportation needs    Medical: Not on file    Non-medical: Not on file  Tobacco Use  . Smoking status: Never Smoker  . Smokeless tobacco: Never Used  Substance and Sexual Activity  . Alcohol use: No  . Drug use: No  . Sexual activity: Not on file  Lifestyle  . Physical activity    Days per week: Not on file    Minutes per session: Not on file  . Stress: Not on file  Relationships  . Social Musicianconnections    Talks on phone: Not on file    Gets together: Not on file    Attends religious service: Not on file    Active member of club or organization: Not on file    Attends meetings of clubs or organizations: Not on file    Relationship status: Not on file  . Intimate partner violence    Fear of current or ex partner: Not on file    Emotionally abused: Not on file    Physically abused: Not on file    Forced sexual activity: Not on file  Other Topics Concern  . Not on file  Social History Narrative  . Not on file    Review of systems: Review of Systems  Constitutional: Negative for fever and chills  HENT: Positive for nasal congestion, and sore throat Eyes: Negative for blurred vision.  Respiratory: as per HPI  Cardiovascular: Negative for chest pain and palpitations.  Gastrointestinal: She does have chronic abdominal pain, but denies reflux Genitourinary: Negative for dysuria, urgency, frequency and hematuria.  Musculoskeletal: Negative for myalgias, back pain and joint pain.  Skin: No rashes Neurological: Negative for dizziness, tremors, focal weakness, seizures and loss of consciousness.  Endo/Heme/Allergies: Positive for allergies Psychiatric/Behavioral: Negative for depression, suicidal ideas and hallucinations.  All other systems reviewed and are negative.  Physical Exam: Today's Vitals   01/23/19 1557  BP: 112/70  Pulse: 87  Temp: (!) 97.4 F  (36.3 C)  TempSrc: Temporal  SpO2: 100%  Weight: 126 lb (57.2 kg)  Height: 5\' 6"  (1.676 m)   Body mass index is 20.34 kg/m.  Gen:      No acute distress HEENT: There is erythema in the posterior oropharynx as well as cobblestoning, nasal debris Neck:     No masses; no thyromegaly Lungs:    Clear to auscultation bilaterally; normal respiratory effort, no wheezes, or crackles CV:         Regular rate and rhythm; no murmurs, rubs, or gallops Abd:      + bowel sounds; soft, non-tender Ext:    No edema Skin:      Warm and dry; no rashes Neuro: alert and oriented x 3 Psych: normal mood and affect  Data Reviewed: Imaging: Chest x-ray from April 2018 reviewed there is no acute cardiopulmonary process  PFTs:  No PFTs on file  Labs: Chemistry and CBC from 2019 are unremarkable  Assessment:  Chronic cough Allergic rhinitis  Plan/Recommendations: I recommend adhering to a regimen of fluticasone intranasal twice daily.  We discussed appropriate intranasal steroid technique.  Since she has had benefit from anti-histamines, would also start Benadryl 25 mg at nighttime to help with the nocturnal symptoms.  She can start Singulair in the morning for the rhinitis as well.  I expressed to the husband the importance of adhering to his regimen for 2 weeks straight before being it has no benefit.  Discussed the multifactorial nature of chronic cough, and that it is often a stepwise approach to determine the ultimate cause.  Return to Care: Return in about 8 weeks (around 03/20/2019).  Durel Salts, MD Pulmonary and Critical Care Medicine [facility] Office:[phone]

## 2019-01-23 NOTE — Telephone Encounter (Signed)
Call returned to patient husband. He states he did not need a call he states he just told the front desk his wife has had a cough for 4 weeks and has been tested for covid and it was negative. He reports a cough is the only symptom she was having. Denies all other symptoms. Denies fever, N/V/D. Denies having been around anyone with covid. I made him aware screening will be done again and be sure to wear a mask. I inquired as to where she was tested, as not available in epic. He was unable to recall and his wife was not around at the time to confirm. Made aware we would conduct screening again at the front door and to wear a mask. Voiced understanding. Nothing further needed at this time.

## 2019-01-23 NOTE — Progress Notes (Signed)
I have received an after hours call from this patient's husband. The pharmacy was unable to fill the patient's Singulair 10 mg daily  prescription due to Dr. Shearon Stalls not having a contract with Medicaid. I have sent in an prescription for  Singulair 10 mg daily to be filled under my name. . The pharmacist confirmed the prescription went through. Nothing further was needed.

## 2019-11-23 IMAGING — US US TRANSVAGINAL NON-OB
1 series · 15 of 25 positions shown · non-contrast
Comparison: 01/10/2018

CLINICAL DATA: Abnormal uterine bleeding

EXAM:
ULTRASOUND PELVIS TRANSVAGINAL
TECHNIQUE: Transvaginal ultrasound examination of the pelvis was performed
including evaluation of the uterus, ovaries, adnexal regions, and
pelvic cul-de-sac.

[Series 1: us transvaginal non-ob · 15 of 50 slices shown]
[im 1/50]
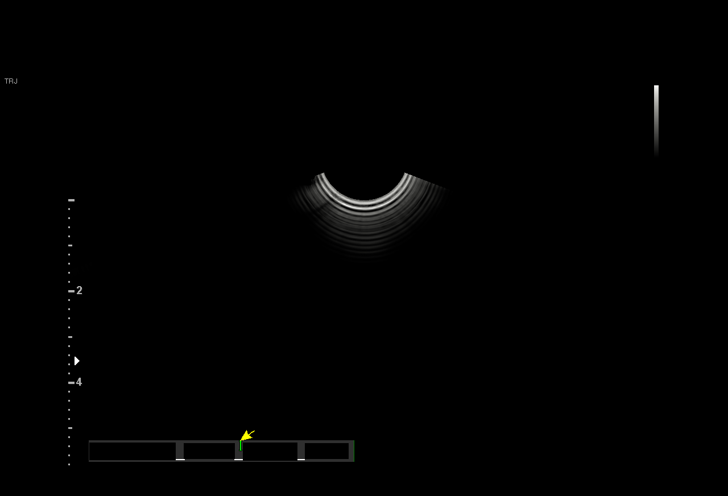
[im 5/50]
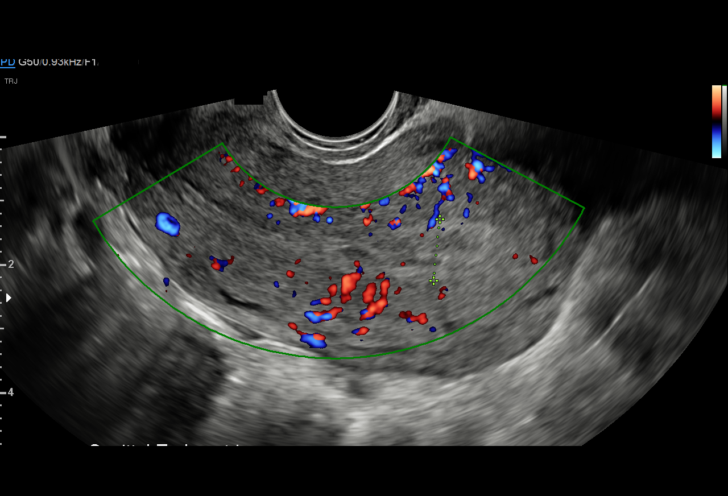
[im 9/50]
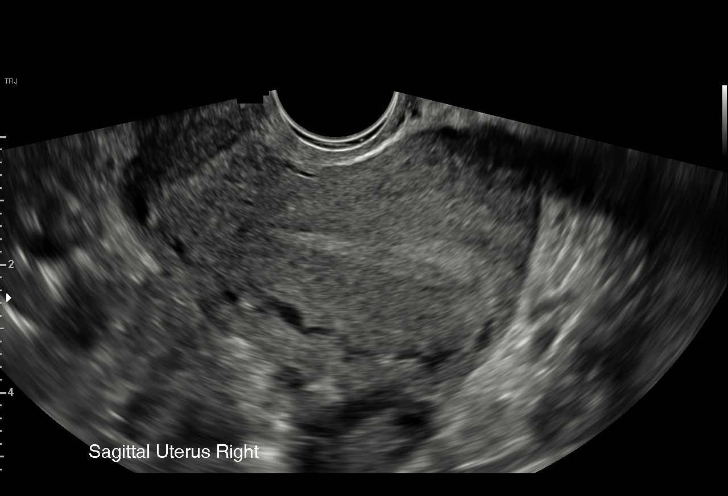
[im 11/50]
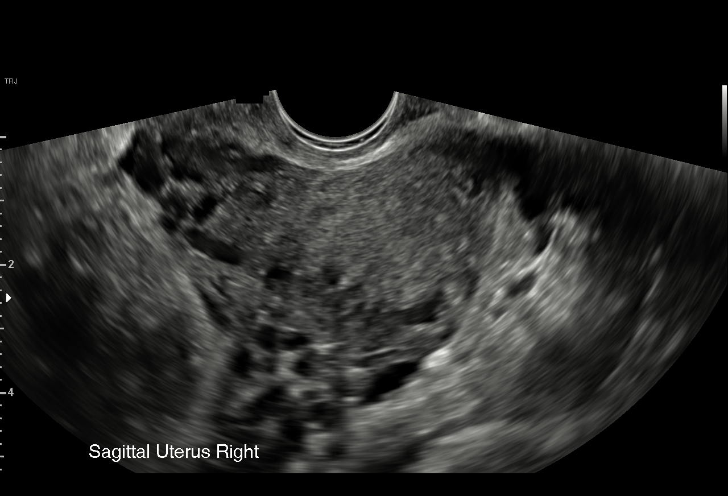
[im 15/50]
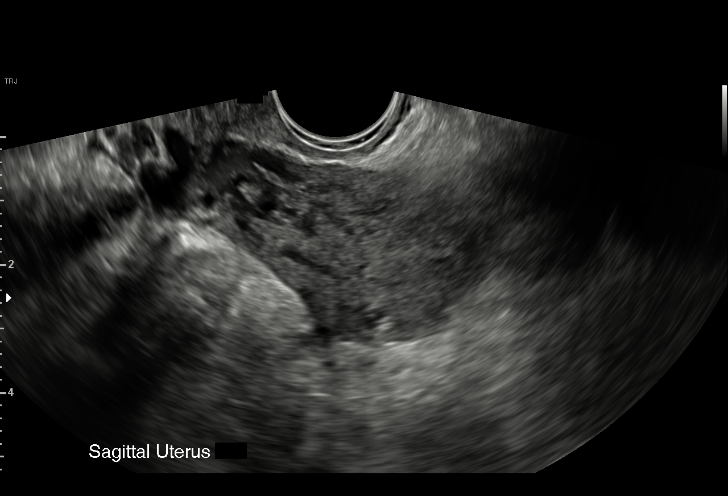
[im 19/50]
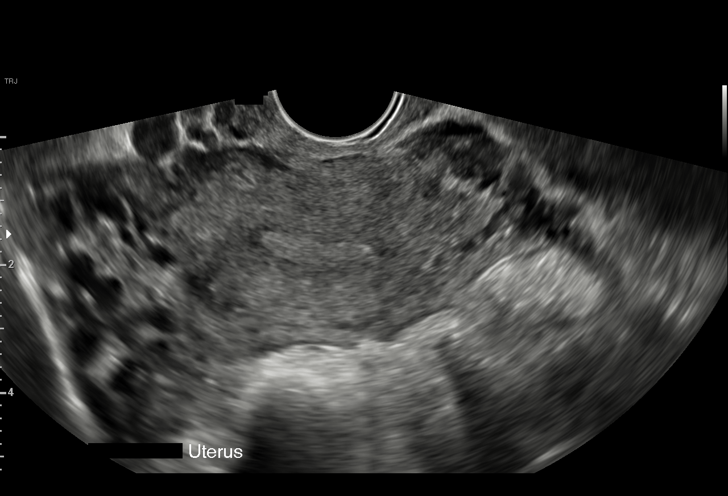
[im 21/50]
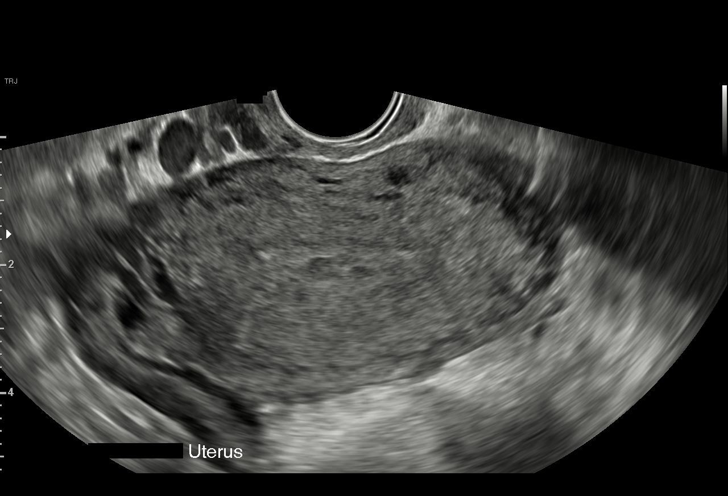
[im 25/50]
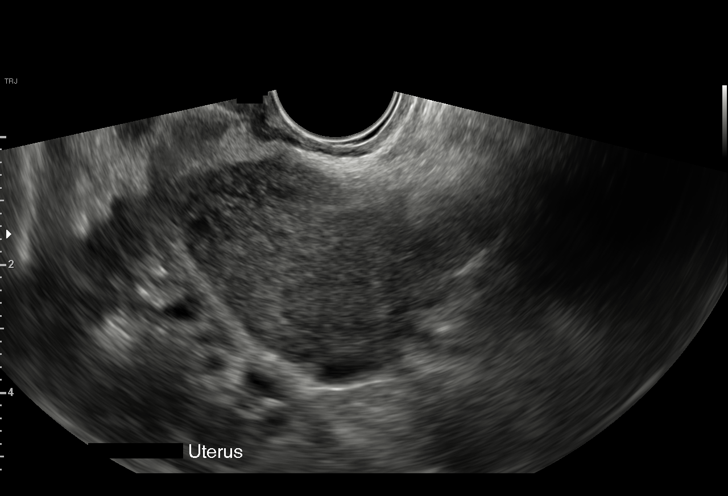
[im 29/50]
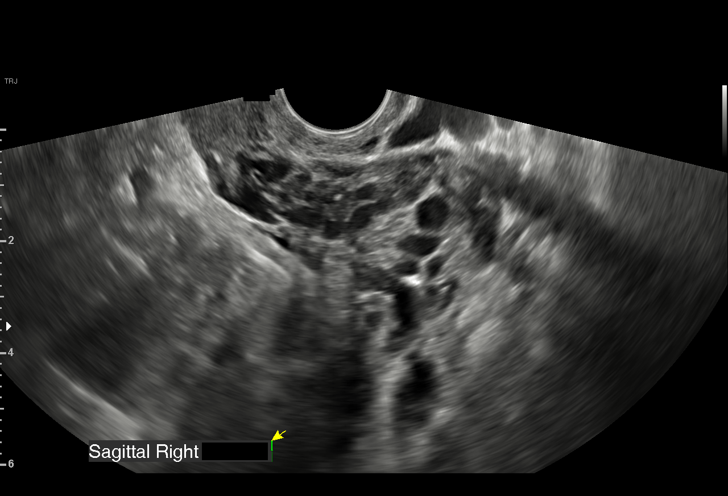
[im 31/50]
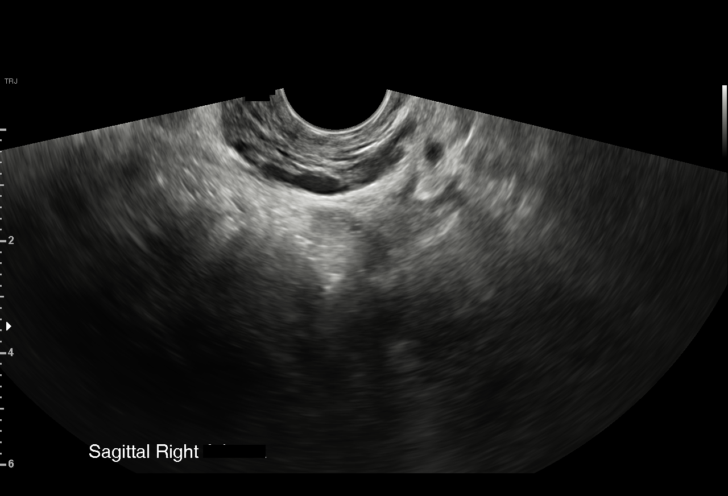
[im 35/50]
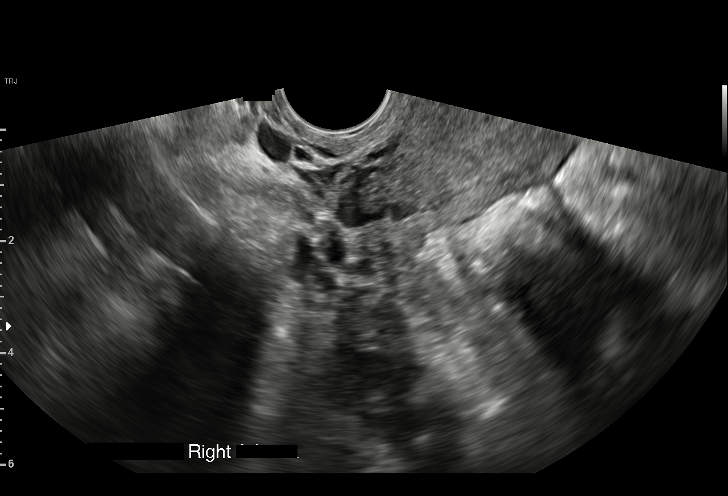
[im 39/50]
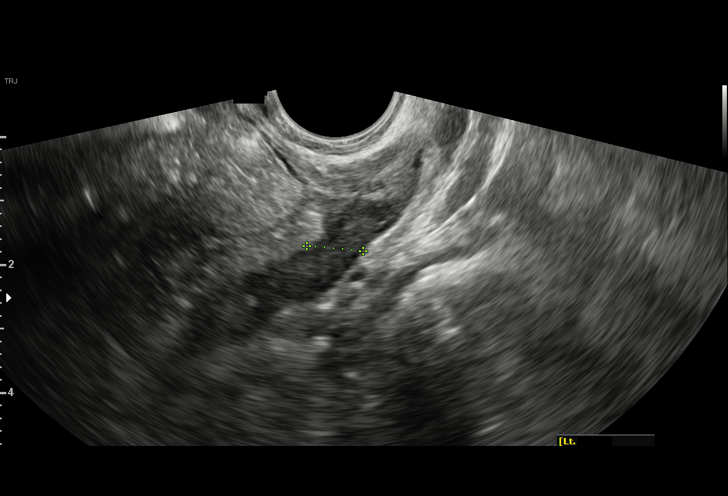
[im 41/50]
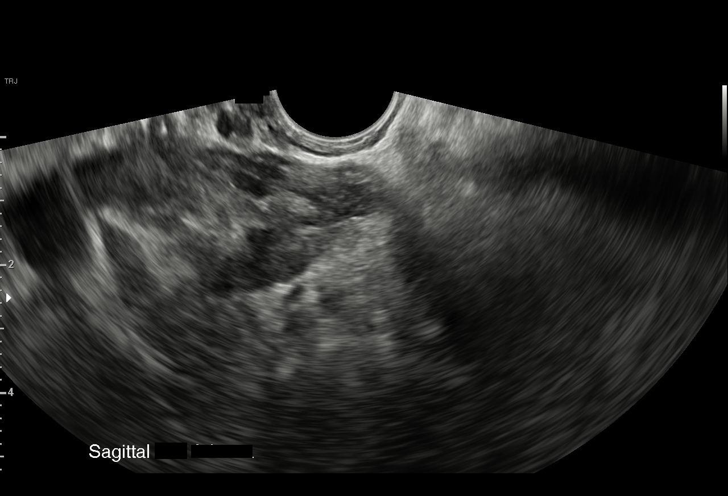
[im 45/50]
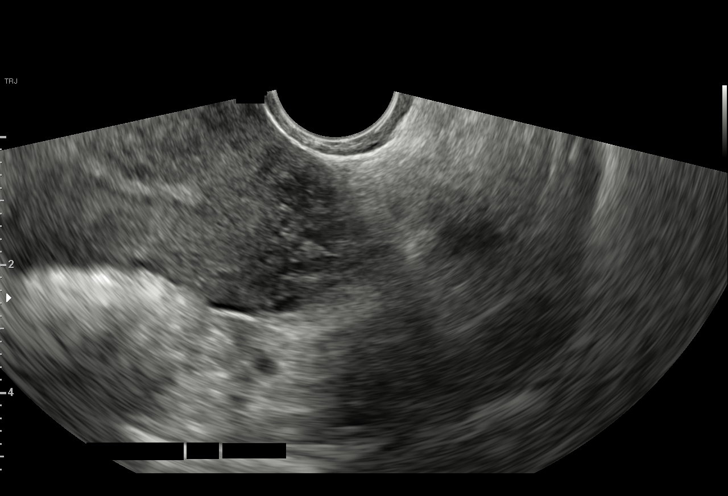
[im 50/50]
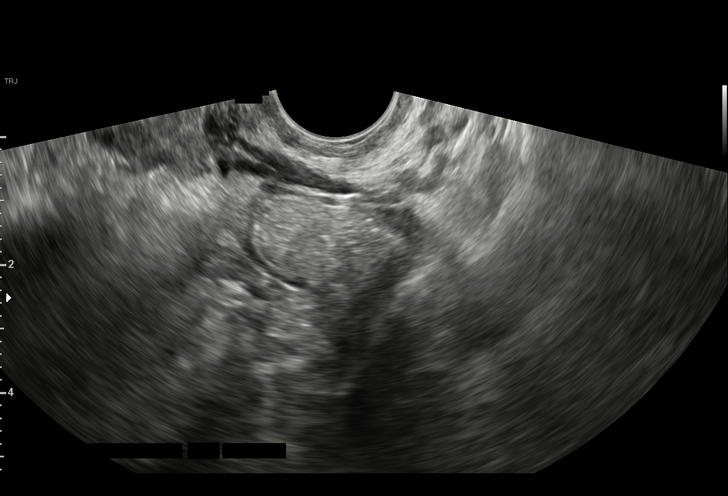

[15 of 25 positions shown; findings below may reference images not displayed]

FINDINGS: Uterus

Measurements: Retroverted, measuring 8.9 x 4.1 x 6.0 cm = volume:
114.9 mL. No fibroids or other mass visualized.

Endometrium

Thickness: 9 mm in thickness. Mobile heterogeneous debris within the
endometrial canal, likely blood.. No focal abnormality visualized.

Right ovary

Measurements: 3.5 x 1.4 x 2.1 cm = volume: 5.3 mL. Normal
appearance/no adnexal mass.

Left ovary

Measurements: 2.9 x 1.2 x 0.9 cm = volume: 1.7 mL. Normal
appearance/no adnexal mass.

Other findings:  No abnormal free fluid
IMPRESSION: Mobile debris/blood products within the endometrial canal. No
visible focal endometrial lesion or endometrial thickening.

Is is please select correct "US Pelvis" template depending on
combination of exams performed / being read (e.g. both, Doppler,
etc).

## 2020-02-11 ENCOUNTER — Other Ambulatory Visit: Payer: Self-pay | Admitting: Acute Care

## 2020-02-11 DIAGNOSIS — J329 Chronic sinusitis, unspecified: Secondary | ICD-10-CM

## 2020-02-11 IMAGING — US US ART/VEN ABD/PELV/SCROTUM DOPPLER LTD
1 series · 13 of 25 positions shown · non-contrast
Comparison: CT 12/07/2017

CLINICAL DATA: Vaginal bleeding 2 weeks with clots. LMP 12/18/2017.

EXAM:
TRANSABDOMINAL AND TRANSVAGINAL ULTRASOUND OF PELVIS
DOPPLER ULTRASOUND OF OVARIES
TECHNIQUE: Both transabdominal and transvaginal ultrasound examinations of the
pelvis were performed. Transabdominal technique was performed for
global imaging of the pelvis including uterus, ovaries, adnexal
regions, and pelvic cul-de-sac.
It was necessary to proceed with endovaginal exam following the
transabdominal exam to visualize the endometrium and ovaries. Color
and duplex Doppler ultrasound was utilized to evaluate blood flow to
the ovaries.

[Series 1: us art/ven abd/pelv/scrotum doppler ltd · 0.21mm/px · 13 of 56 slices shown]
[im 1/56]
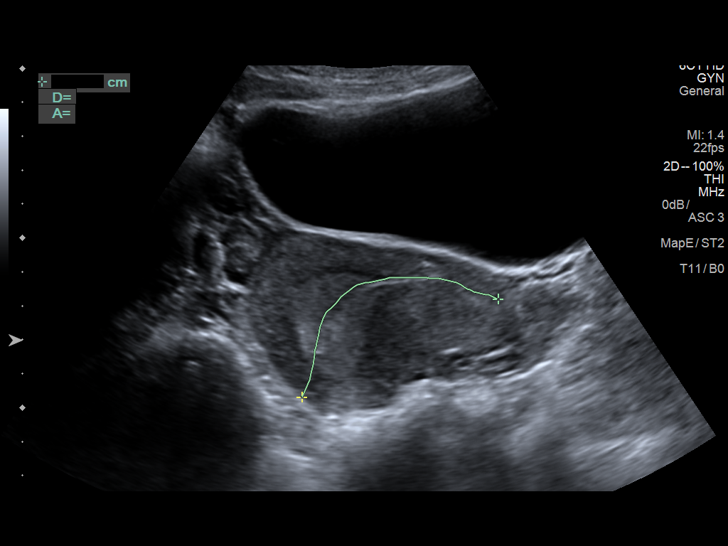
[im 5/56]
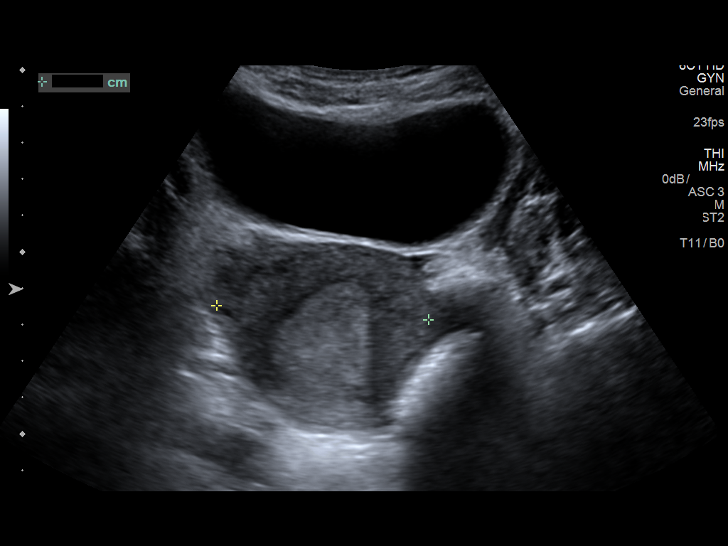
[im 10/56]
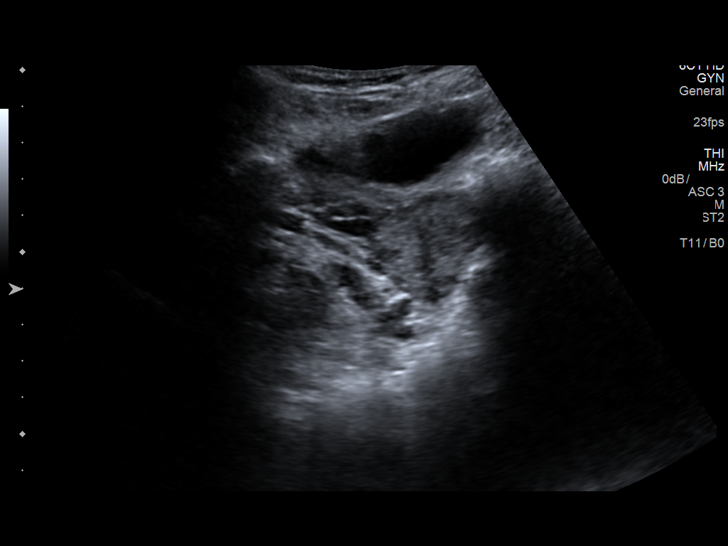
[im 14/56]
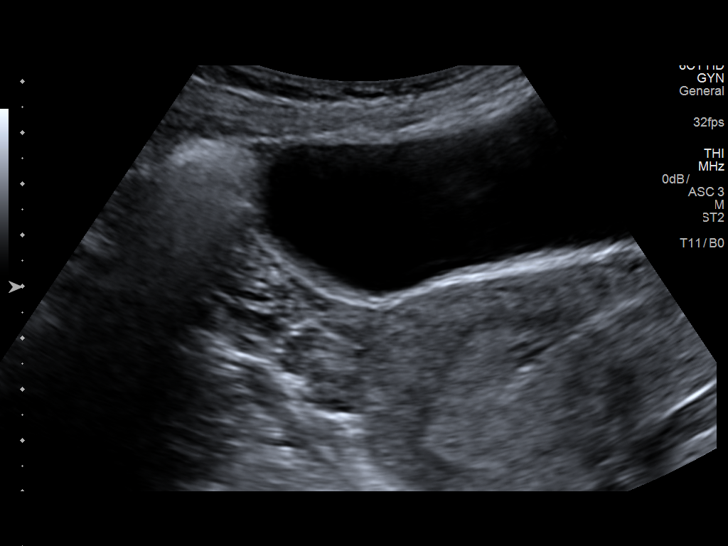
[im 19/56]
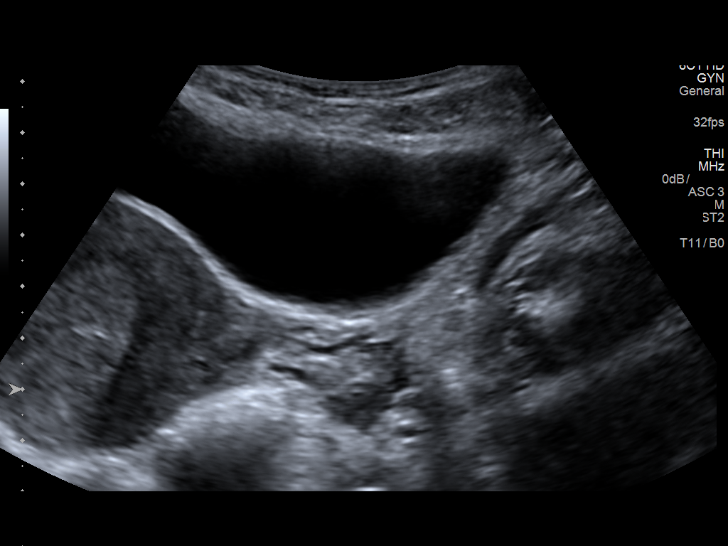
[im 23/56]
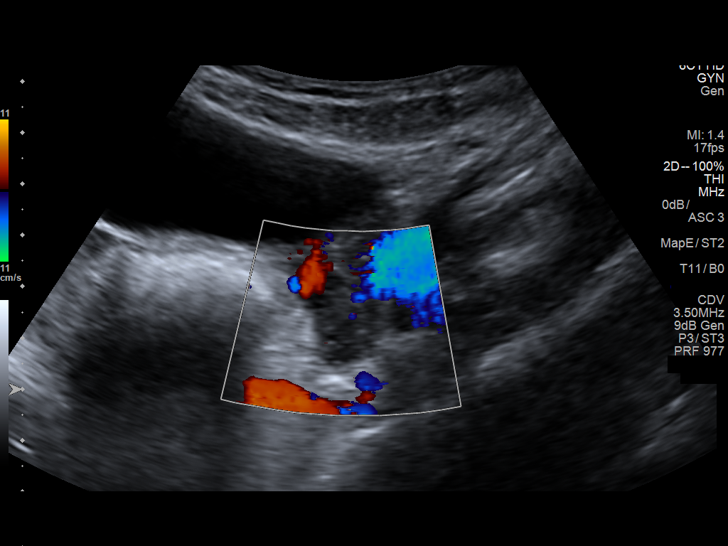
[im 28/56]
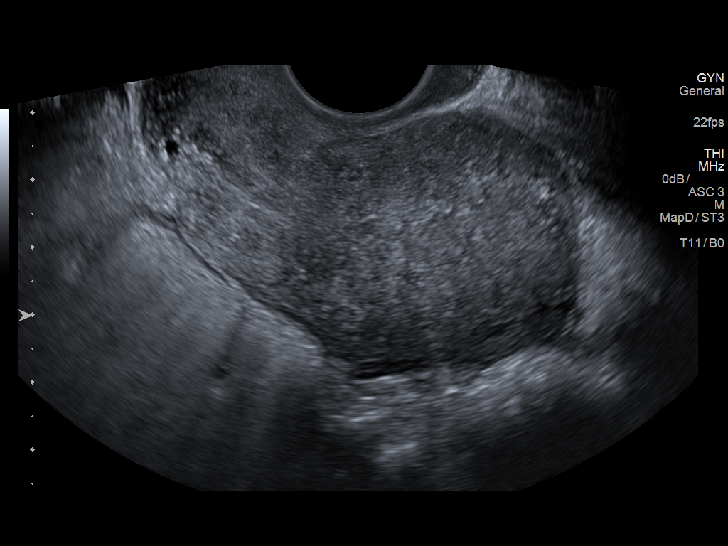
[im 33/56]
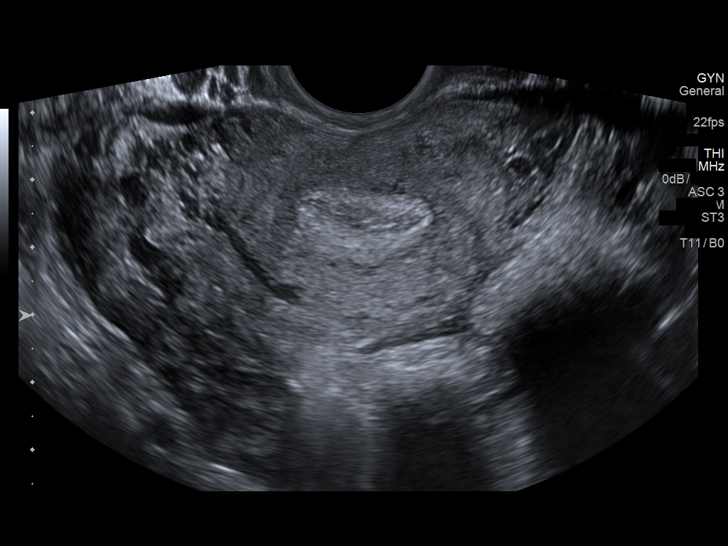
[im 37/56]
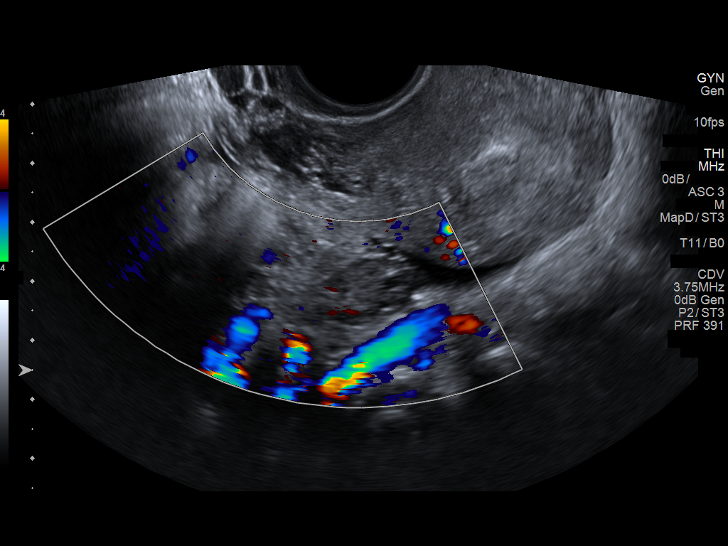
[im 42/56]
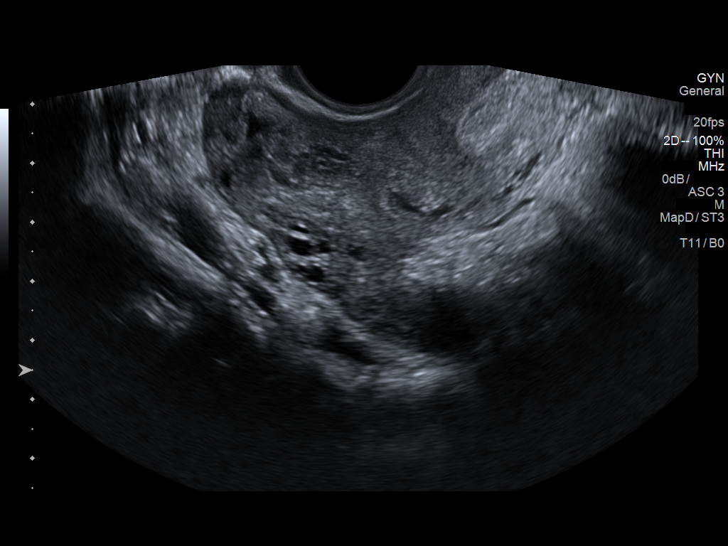
[im 46/56]
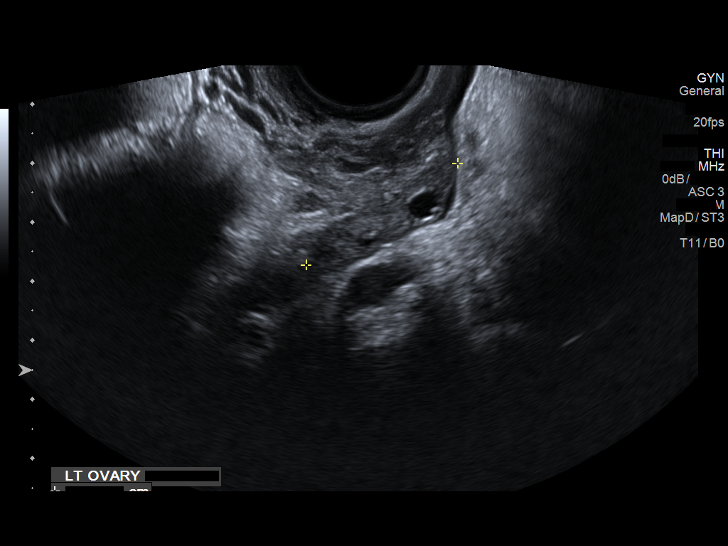
[im 51/56]
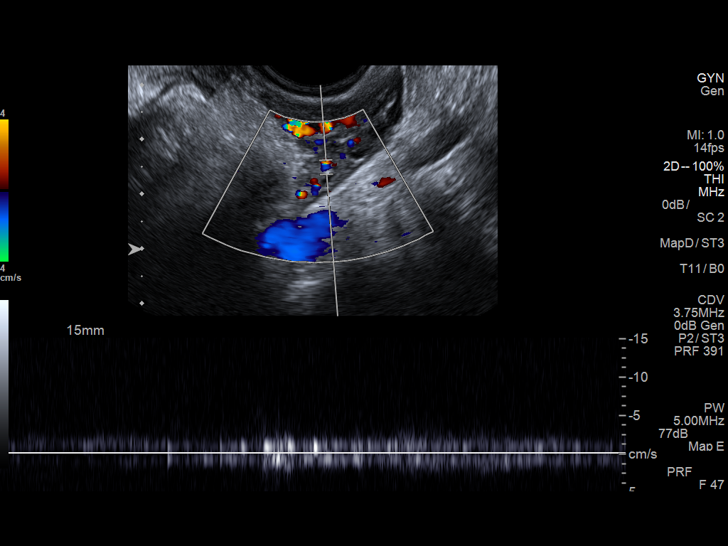
[im 56/56]
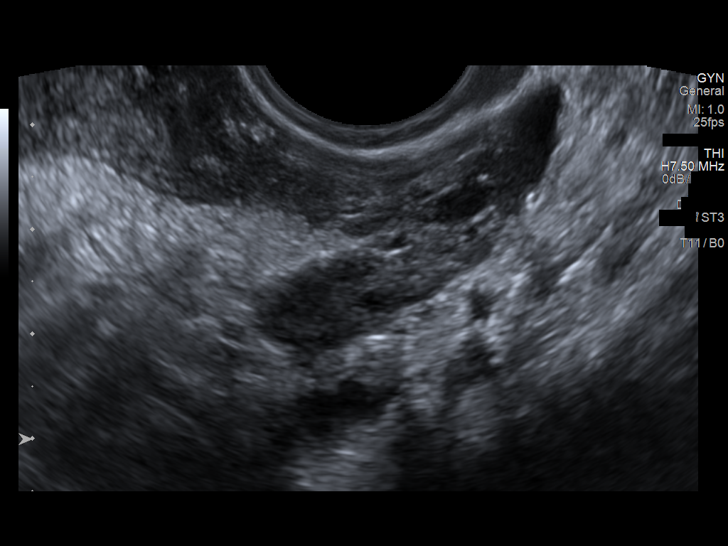

[13 of 25 positions shown; findings below may reference images not displayed]

FINDINGS: Uterus

Measurements: 4.4 x 6.2 x 7.6 cm. Retroverted. Increased
vascularity. No fibroids or other mass visualized.

Endometrium

Thickness: 22 mm.  No focal abnormality visualized.

Right ovary

Measurements: 2.3 x 2.9 x 3.0 cm. Normal appearance/no adnexal mass.

Left ovary

Measurements: 2.3 x 1.9 x 3.2 cm. Normal appearance/no adnexal mass.

Pulsed Doppler evaluation of both ovaries demonstrates normal
low-resistance arterial and venous waveforms.

Other findings

Mild-to-moderate free fluid.
IMPRESSION: Retroverted uterus demonstrating mild endometrial thickening
measuring 22 mm without focal abnormality. Findings may be due to
hyperplasia or polyp and less likely submucosal fibroid or neoplasm.
Recommend GYN consultation.

Normal ovaries.  No evidence of ovarian torsion.

## 2022-04-21 ENCOUNTER — Encounter (HOSPITAL_BASED_OUTPATIENT_CLINIC_OR_DEPARTMENT_OTHER): Payer: Self-pay

## 2022-04-21 ENCOUNTER — Emergency Department (HOSPITAL_BASED_OUTPATIENT_CLINIC_OR_DEPARTMENT_OTHER): Payer: Medicaid Other

## 2022-04-21 ENCOUNTER — Other Ambulatory Visit: Payer: Self-pay

## 2022-04-21 ENCOUNTER — Emergency Department (HOSPITAL_BASED_OUTPATIENT_CLINIC_OR_DEPARTMENT_OTHER)
Admission: EM | Admit: 2022-04-21 | Discharge: 2022-04-21 | Discharge status: Against Medical Advice | Payer: Medicaid Other | Attending: Emergency Medicine | Admitting: Emergency Medicine

## 2022-04-21 DIAGNOSIS — Z5321 Procedure and treatment not carried out due to patient leaving prior to being seen by health care provider: Secondary | ICD-10-CM | POA: Diagnosis not present

## 2022-04-21 DIAGNOSIS — R200 Anesthesia of skin: Secondary | ICD-10-CM | POA: Diagnosis present

## 2022-04-21 DIAGNOSIS — H9202 Otalgia, left ear: Secondary | ICD-10-CM | POA: Insufficient documentation

## 2022-04-21 DIAGNOSIS — R519 Headache, unspecified: Secondary | ICD-10-CM | POA: Diagnosis not present

## 2022-04-21 DIAGNOSIS — R202 Paresthesia of skin: Secondary | ICD-10-CM | POA: Diagnosis not present

## 2022-04-21 LAB — DIFFERENTIAL
Abs Immature Granulocytes: 0 10*3/uL (ref 0.00–0.07)
Basophils Absolute: 0 10*3/uL (ref 0.0–0.1)
Basophils Relative: 0 %
Eosinophils Absolute: 0 10*3/uL (ref 0.0–0.5)
Eosinophils Relative: 1 %
Immature Granulocytes: 0 %
Lymphocytes Relative: 36 %
Lymphs Abs: 1.8 10*3/uL (ref 0.7–4.0)
Monocytes Absolute: 0.4 10*3/uL (ref 0.1–1.0)
Monocytes Relative: 7 %
Neutro Abs: 2.8 10*3/uL (ref 1.7–7.7)
Neutrophils Relative %: 56 %

## 2022-04-21 LAB — URINALYSIS, ROUTINE W REFLEX MICROSCOPIC
Bilirubin Urine: NEGATIVE
Glucose, UA: NEGATIVE mg/dL
Ketones, ur: NEGATIVE mg/dL
Leukocytes,Ua: NEGATIVE
Nitrite: NEGATIVE
Protein, ur: NEGATIVE mg/dL
Specific Gravity, Urine: 1.01 (ref 1.005–1.030)
pH: 6.5 (ref 5.0–8.0)

## 2022-04-21 LAB — COMPREHENSIVE METABOLIC PANEL
ALT: 15 U/L (ref 0–44)
AST: 18 U/L (ref 15–41)
Albumin: 4.2 g/dL (ref 3.5–5.0)
Alkaline Phosphatase: 36 U/L — ABNORMAL LOW (ref 38–126)
Anion gap: 5 (ref 5–15)
BUN: 16 mg/dL (ref 6–20)
CO2: 25 mmol/L (ref 22–32)
Calcium: 8.8 mg/dL — ABNORMAL LOW (ref 8.9–10.3)
Chloride: 106 mmol/L (ref 98–111)
Creatinine, Ser: 0.69 mg/dL (ref 0.44–1.00)
GFR, Estimated: >60 mL/min (ref >60)
Glucose, Bld: 96 mg/dL (ref 70–99)
Potassium: 3.3 mmol/L — ABNORMAL LOW (ref 3.5–5.1)
Sodium: 136 mmol/L (ref 135–145)
Total Bilirubin: 0.7 mg/dL (ref 0.3–1.2)
Total Protein: 7.8 g/dL (ref 6.5–8.1)

## 2022-04-21 LAB — CBC
HCT: 35.3 % — ABNORMAL LOW (ref 36.0–46.0)
Hemoglobin: 11.7 g/dL — ABNORMAL LOW (ref 12.0–15.0)
MCH: 28.8 pg (ref 26.0–34.0)
MCHC: 33.1 g/dL (ref 30.0–36.0)
MCV: 86.9 fL (ref 80.0–100.0)
Platelets: 173 10*3/uL (ref 150–400)
RBC: 4.06 MIL/uL (ref 3.87–5.11)
RDW: 12.8 % (ref 11.5–15.5)
WBC: 5 10*3/uL (ref 4.0–10.5)
nRBC: 0 % (ref 0.0–0.2)

## 2022-04-21 LAB — URINALYSIS, MICROSCOPIC (REFLEX): RBC / HPF: >50 RBC/hpf (ref 0–5)

## 2022-04-21 LAB — PREGNANCY, URINE: Preg Test, Ur: NEGATIVE

## 2022-04-21 LAB — ETHANOL: Alcohol, Ethyl (B): <10 mg/dL (ref <10)

## 2022-04-21 NOTE — ED Notes (Signed)
Pt ambulatory to room with independent steady gait 

## 2022-04-21 NOTE — Discharge Instructions (Addendum)
You were seen in the emergency department today for numbness and tingling to your left side.  You decided to leave prior to completion of a workup including a consult to a neurologist and possible further imaging.  I have referred you to neurology.  If you do not receive a call from them in the next 72 hours please give them a call.  I placed her information in your discharge paperwork.  Please return to emergency department for worsening symptoms.  Please follow-up with a primary care provider as well.

## 2022-04-21 NOTE — ED Notes (Signed)
Lauren PA at bedside

## 2022-04-21 NOTE — ED Notes (Signed)
Pts husband verbalized he was upset about the wait time and wanted to know how much longer it would be until the provider would be in to see him, stated they were ready to leave. He also verbalized complaint about the room being cold. This RN apologized to him about the delay in seeing the doctor and informed him I will go inform the doctor now that they want to be seen asap and that they are ready to go. This RN also offered a warm blanket but he refused. This RN informed Dr. Rogene Houston.

## 2022-04-21 NOTE — ED Provider Notes (Signed)
[facility] Provider Note   CSN: [id number] Arrival date & time: 04/21/22  1529     History  Chief Complaint  Patient presents with   Numbness    Amy Young is a 32 y.o. female.  With past medical history of typhoid who presents to the emergency department with numbness.  She presents with husband who helps to provided some of the history. Patient complains of having numbness and tingling to the left body and face over the past two days. It is constant. She states today she started having headache to the occiput of the left neck and face that is intermittent. She denies head or neck trauma, weakness of the extremities, changes to her vision, slurred speech, facial droop, confusion. She denies having grip weakness/dropping items, dragging her leg. Denies chest or back pain. She endorses limited PO intake chronically.   HPI     Home Medications Prior to Admission medications   Medication Sig Start Date End Date Taking? Authorizing Provider  cetirizine (ZYRTEC) 10 MG tablet Take 10 mg by mouth daily.    [provider]  dextromethorphan-guaiFENesin (MUCINEX DM) 30-600 MG 12hr tablet Take 1 tablet by mouth 2 (two) times daily.    [provider]  diphenhydrAMINE (BENADRYL) 25 MG tablet Take 1 tablet (25 mg total) by mouth at bedtime. 01/23/19   Spero Geralds, MD  fexofenadine (ALLEGRA) 180 MG tablet Take 180 mg by mouth daily.    [provider]  fluticasone (FLONASE) 50 MCG/ACT nasal spray Place 1 spray into both nostrils daily. 11/01/18   Blanchie Dessert, MD  montelukast (SINGULAIR) 10 MG tablet Take 1 tablet (10 mg total) by mouth at bedtime. 01/23/19   Magdalen Spatz, NP  Multiple Vitamin (MULTIVITAMIN WITH MINERALS) TABS tablet Take 1 tablet by mouth daily.    [provider]  norgestimate-ethinyl estradiol (ORTHO-CYCLEN,SPRINTEC,PREVIFEM) 0.25-35 MG-MCG tablet Take 1 tablet by mouth daily. 01/31/18    Constant, Peggy, MD  omeprazole (PRILOSEC) 20 MG capsule Take 1 capsule (20 mg total) by mouth daily. 11/01/18   Blanchie Dessert, MD      Allergies    Progesterone    Review of Systems   Review of Systems  Neurological:  Positive for numbness and headaches.  All other systems reviewed and are negative.   Physical Exam Updated Vital Signs BP 110/79 (BP Location: Right Arm)   Pulse 72   Temp 98.2 F (36.8 C) (Oral)   Resp 16   Ht 5' 6"$  (1.676 m)   Wt 63.5 kg   LMP 04/18/2022 (Approximate)   SpO2 99%   BMI 22.60 kg/m  Physical Exam Vitals and nursing note reviewed.  Constitutional:      General: She is not in acute distress.    Appearance: Normal appearance. She is not ill-appearing or toxic-appearing.  HENT:     Head: Normocephalic.     Mouth/Throat:     Mouth: Mucous membranes are moist.     Pharynx: Oropharynx is clear.  Eyes:     General: No visual field deficit or scleral icterus.    Extraocular Movements: Extraocular movements intact.     Pupils: Pupils are equal, round, and reactive to light.  Cardiovascular:     Rate and Rhythm: Normal rate and regular rhythm.     Pulses: Normal pulses.     Heart sounds: No murmur heard. Pulmonary:     Effort: Pulmonary effort is normal. No respiratory distress.  Breath sounds: Normal breath sounds.  Abdominal:     General: Bowel sounds are normal.     Palpations: Abdomen is soft.  Musculoskeletal:        General: Normal range of motion.     Cervical back: Normal range of motion and neck supple. No rigidity or tenderness.  Skin:    General: Skin is warm and dry.     Capillary Refill: Capillary refill takes less than 2 seconds.  Neurological:     General: No focal deficit present.     Mental Status: She is alert and oriented to person, place, and time.     Cranial Nerves: Cranial nerves 2-12 are intact. No dysarthria or facial asymmetry.     Sensory: Sensory deficit present.     Motor: Motor function is intact. No  weakness or pronator drift.     Coordination: Coordination is intact.     Gait: Gait is intact.     Comments: Subjective change in sensation to the face ,arm and leg on left  Psychiatric:        Mood and Affect: Mood normal.        Behavior: Behavior normal.     ED Results / Procedures / Treatments   Labs (all labs ordered are listed, but only abnormal results are displayed) Labs Reviewed  CBC - Abnormal; Notable for the following components:      Result Value   Hemoglobin 11.7 (*)    HCT 35.3 (*)    All other components within normal limits  COMPREHENSIVE METABOLIC PANEL - Abnormal; Notable for the following components:   Potassium 3.3 (*)    Calcium 8.8 (*)    Alkaline Phosphatase 36 (*)    All other components within normal limits  URINALYSIS, ROUTINE W REFLEX MICROSCOPIC - Abnormal; Notable for the following components:   Hgb urine dipstick LARGE (*)    All other components within normal limits  URINALYSIS, MICROSCOPIC (REFLEX) - Abnormal; Notable for the following components:   Bacteria, UA RARE (*)    All other components within normal limits  DIFFERENTIAL  ETHANOL  PREGNANCY, URINE    EKG EKG Interpretation  Date/Time:  Thursday April 21 2022 15:39:47 EST Ventricular Rate:  81 PR Interval:  140 QRS Duration: 79 QT Interval:  378 QTC Calculation: 439 R Axis:   81 Text Interpretation: Sinus rhythm Confirmed by Fredia Sorrow [phone]) on 04/21/2022 8:57:02 PM  Radiology No results found.  Procedures Procedures   Medications Ordered in ED Medications - No data to display  ED Course/ Medical Decision Making/ A&P    Medical Decision Making Amount and/or Complexity of Data Reviewed Labs: ordered. Radiology: ordered.  Initial Impression and Ddx 33 year old well appearing female who presents with 2 days of numbness and tingling to the left side. Non septic and non toxic in appearance.  Patient PMH that increases complexity of ED encounter:   none Differential: stroke, TIA, MS, venous thrombosis, vertebral artery dissection/insufficiency, mass, etc.   Interpretation of Diagnostics I independent reviewed and interpreted the labs as followed: cbc without leukocytosis, mild anemia which is stable. UA negative. Cmp with K 3.3. no AKI or other electrolyte derangement  - I independently visualized the following imaging with scope of interpretation limited to determining acute life threatening conditions related to emergency care: CT head, which revealed no acute findings  Patient Reassessment and Ultimate Disposition/Management Well appearing 32 year old female with numbness, tingling. Exam with subjective changes. No objective findings.  CT head without  stroke, mass.  I had discussion at bedside with patient and husband recommending transfer to [facility] for MRI and consult with neurology. They both declined and wished to go home at this time. I discussed possibilities of serious underlying pathology including stroke, TIA, MS, etc. Additionally, could be vitamin deficiency. Doubt spinal cord origin given facial involvement. They verbalized understanding but continued to refuse at this time. They did not want to stay for neurology consult either.   I sent an ambulatory referral to neurology and gave strict return precautions for worsening symptoms. Additionally patient signed AMA.  The patient has decided to leave against medical advice. The patient had a normal mental status examination and understands her condition and the risks of leaving including worsening of symptoms, stroke, neurodegenerative disorder, TIA, permanent disability, death. The patient has had an opportunity to ask questions about her medical condition. The patient has been informed  that she may return for care at any time and has been referred to neurology and primary care physician. The patient's family member was present for the conversation.   Patient management  required discussion with the following services or consulting groups:  None  Complexity of Problems Addressed Acute complicated illness or Injury  Additional Data Reviewed and Analyzed Further history obtained from: Further history from spouse/family member, Past medical history and medications listed in the EMR, Prior ED visit notes, and Care Everywhere  Patient Encounter Risk Assessment Consideration of hospitalization  Final Clinical Impression(s) / ED Diagnoses Final diagnoses:  Numbness    Rx / DC Orders ED Discharge Orders          Ordered    Ambulatory referral to Neurology       Comments: An appointment is requested in approximately: 1 week   04/21/22 2139              Mickie Hillier, PA-C 04/27/22 1519    Fredia Sorrow, MD 05/01/22 1525

## 2022-04-21 NOTE — ED Triage Notes (Signed)
States Tuesday night was lying down and started feeling numbness in left side of body with some tingling, denies pain. Numbness/tingling ongoing since, now having pain behind left ear, & headache.  Dx with UTI last week, completed antibiotics today. Stroke screen negative.

## 2022-05-04 ENCOUNTER — Ambulatory Visit: Payer: Medicaid Other | Admitting: Neurology

## 2022-05-04 ENCOUNTER — Encounter: Payer: Self-pay | Admitting: Neurology
